# Patient Record
Sex: Male | Born: 1965 | Race: White | Hispanic: No | Marital: Married | Smoking: Former smoker
Health system: Southern US, Community
[De-identification: ages and names within clinical notes are randomized; demographics above are authoritative.]

## PROBLEM LIST (undated history)

## (undated) DIAGNOSIS — Z00129 Encounter for routine child health examination without abnormal findings: Secondary | ICD-10-CM

## (undated) DIAGNOSIS — F419 Anxiety disorder, unspecified: Secondary | ICD-10-CM

## (undated) DIAGNOSIS — N135 Crossing vessel and stricture of ureter without hydronephrosis: Secondary | ICD-10-CM

## (undated) DIAGNOSIS — Z003 Encounter for examination for adolescent development state: Secondary | ICD-10-CM

## (undated) DIAGNOSIS — R7989 Other specified abnormal findings of blood chemistry: Secondary | ICD-10-CM

## (undated) HISTORY — PX: ROTATOR CUFF REPAIR: SHX139

## (undated) HISTORY — DX: Encounter for examination for adolescent development state: Z00.3

## (undated) HISTORY — DX: Other specified abnormal findings of blood chemistry: R79.89

## (undated) HISTORY — PX: OTHER SURGICAL HISTORY: SHX169

## (undated) HISTORY — DX: Anxiety disorder, unspecified: F41.9

## (undated) HISTORY — DX: Crossing vessel and stricture of ureter without hydronephrosis: N13.5

## (undated) HISTORY — DX: Encounter for routine child health examination without abnormal findings: Z00.129

---

## 2001-02-19 HISTORY — PX: OTHER SURGICAL HISTORY: SHX169

## 2005-02-19 HISTORY — PX: OTHER SURGICAL HISTORY: SHX169

## 2005-08-16 ENCOUNTER — Emergency Department (HOSPITAL_COMMUNITY): Admission: EM | Admit: 2005-08-16 | Discharge: 2005-08-16 | Payer: Self-pay | Admitting: Emergency Medicine

## 2005-08-18 ENCOUNTER — Emergency Department (HOSPITAL_COMMUNITY): Admission: EM | Admit: 2005-08-18 | Discharge: 2005-08-18 | Payer: Self-pay | Admitting: *Deleted

## 2014-08-11 ENCOUNTER — Other Ambulatory Visit: Payer: Self-pay | Admitting: Orthopedic Surgery

## 2014-08-11 DIAGNOSIS — M779 Enthesopathy, unspecified: Secondary | ICD-10-CM

## 2014-08-11 DIAGNOSIS — R52 Pain, unspecified: Secondary | ICD-10-CM

## 2014-08-18 ENCOUNTER — Other Ambulatory Visit: Payer: Self-pay

## 2017-01-19 HISTORY — PX: NM MYOVIEW LTD: HXRAD82

## 2017-02-07 ENCOUNTER — Ambulatory Visit (INDEPENDENT_AMBULATORY_CARE_PROVIDER_SITE_OTHER): Payer: Self-pay | Admitting: Cardiology

## 2017-02-07 ENCOUNTER — Encounter: Payer: Self-pay | Admitting: Cardiology

## 2017-02-07 VITALS — BP 102/80 | HR 53 | Ht 72.0 in | Wt 207.8 lb

## 2017-02-07 DIAGNOSIS — R079 Chest pain, unspecified: Secondary | ICD-10-CM | POA: Insufficient documentation

## 2017-02-07 MED ORDER — ASPIRIN EC 81 MG PO TBEC: 81.0000 mg | DELAYED_RELEASE_TABLET | Freq: Every day | ORAL | 3 refills | Status: DC

## 2017-02-07 MED ORDER — NITROGLYCERIN 0.4 MG SL SUBL: 0.4000 mg | SUBLINGUAL_TABLET | SUBLINGUAL | 6 refills | Status: DC | PRN

## 2017-02-07 NOTE — Patient Instructions (Signed)
MEDICATIONS INSTRUCTIONS  -- START ASPIRIN 81 MG ONE TABLET DAILY   --IF NEEDED TAKE NITROGLYCERIN 0.4 MG  ONE TABLET EVERY 5 MINUTES UP TO 3 TIMES WITH EPISODES     TESTING SCHEDULE AT [facility] [address] Your physician has requested that you have en exercise stress myoview. For further information please visit [email or web address]. Please follow instruction sheet, as given.   Your physician recommends that you schedule a follow-up appointment in [place].     Nitroglycerin sublingual tablets What is this medicine? NITROGLYCERIN (nye troe GLI ser in) is a type of vasodilator. It relaxes blood vessels, increasing the blood and oxygen supply to your heart. This medicine is used to relieve chest pain caused by angina. It is also used to prevent chest pain before activities like climbing stairs, going outdoors in cold weather, or sexual activity. This medicine may be used for other purposes; ask your health care provider or pharmacist if you have questions. COMMON BRAND NAME(S): Nitroquick, Nitrostat, Nitrotab What should I tell my health care provider before I take this medicine? They need to know if you have any of these conditions: -anemia -head injury, recent stroke, or bleeding in the brain -liver disease -previous heart attack -an unusual or allergic reaction to nitroglycerin, other medicines, foods, dyes, or preservatives -pregnant or trying to get pregnant -breast-feeding How should I use this medicine? Take this medicine by mouth as needed. At the first sign of an angina attack (chest pain or tightness) place one tablet under your tongue. You can also take this medicine 5 to 10 minutes before an event likely to produce chest pain. Follow the directions on the prescription label. Let the tablet dissolve under the tongue. Do not swallow whole. Replace the dose if you accidentally swallow it. It will help if your mouth is not dry. Saliva around the tablet  will help it to dissolve more quickly. Do not eat or drink, smoke or chew tobacco while a tablet is dissolving. If you are not better within 5 minutes after taking ONE dose of nitroglycerin, call 9-1-1 immediately to seek emergency medical care. Do not take more than 3 nitroglycerin tablets over 15 minutes. If you take this medicine often to relieve symptoms of angina, your doctor or health care professional may provide you with different instructions to manage your symptoms. If symptoms do not go away after following these instructions, it is important to call 9-1-1 immediately. Do not take more than 3 nitroglycerin tablets over 15 minutes. Talk to your pediatrician regarding the use of this medicine in children. Special care may be needed. Overdosage: If you think you have taken too much of this medicine contact a poison control center or emergency room at once. NOTE: This medicine is only for you. Do not share this medicine with others. What if I miss a dose? This does not apply. This medicine is only used as needed. What may interact with this medicine? Do not take this medicine with any of the following medications: -certain migraine medicines like ergotamine and dihydroergotamine (DHE) -medicines used to treat erectile dysfunction like sildenafil, tadalafil, and vardenafil -riociguat This medicine may also interact with the following medications: -alteplase -aspirin -heparin -medicines for high blood pressure -medicines for mental depression -other medicines used to treat angina -phenothiazines like chlorpromazine, mesoridazine, prochlorperazine, thioridazine This list may not describe all possible interactions. Give your health care provider a list of all the medicines, herbs, non-prescription drugs, or dietary supplements you use. Also  tell them if you smoke, drink alcohol, or use illegal drugs. Some items may interact with your medicine. What should I watch for while using this  medicine? Tell your doctor or health care professional if you feel your medicine is no longer working. Keep this medicine with you at all times. Sit or lie down when you take your medicine to prevent falling if you feel dizzy or faint after using it. Try to remain calm. This will help you to feel better faster. If you feel dizzy, take several deep breaths and lie down with your feet propped up, or bend forward with your head resting between your knees. You may get drowsy or dizzy. Do not drive, use machinery, or do anything that needs mental alertness until you know how this drug affects you. Do not stand or sit up quickly, especially if you are an older patient. This reduces the risk of dizzy or fainting spells. Alcohol can make you more drowsy and dizzy. Avoid alcoholic drinks. Do not treat yourself for coughs, colds, or pain while you are taking this medicine without asking your doctor or health care professional for advice. Some ingredients may increase your blood pressure. What side effects may I notice from receiving this medicine? Side effects that you should report to your doctor or health care professional as soon as possible: -blurred vision -dry mouth -skin rash -sweating -the feeling of extreme pressure in the head -unusually weak or tired Side effects that usually do not require medical attention (report to your doctor or health care professional if they continue or are bothersome): -flushing of the face or neck -headache -irregular heartbeat, palpitations -nausea, vomiting This list may not describe all possible side effects. Call your doctor for medical advice about side effects. You may report side effects to FDA at 1-800-FDA-1088. Where should I keep my medicine? Keep out of the reach of children. Store at room temperature between 20 and 25 degrees C (68 and 77 degrees F). Store in [facility] [employer]. Protect from light and moisture. Keep tightly closed. Throw away any unused  medicine after the expiration date. NOTE: This sheet is a summary. It may not cover all possible information. If you have questions about this medicine, talk to your doctor, pharmacist, or health care provider.  2018 Elsevier/Gold Standard (2012-12-04 17:57:36)     Cardiac Nuclear Scan A cardiac nuclear scan is a test that measures blood flow to the heart when a person is resting and when he or she is exercising. The test looks for problems such as:  Not enough blood reaching a portion of the heart.  The heart muscle not working normally.  You may need this test if:  You have heart disease.  You have had abnormal lab results.  You have had heart surgery or angioplasty.  You have chest pain.  You have shortness of breath.  In this test, a radioactive dye (tracer) is injected into your bloodstream. After the tracer has traveled to your heart, an imaging device is used to measure how much of the tracer is absorbed by or distributed to various areas of your heart. This procedure is usually done at a hospital and takes 2-4 hours. Tell a health care provider about:  Any allergies you have.  All medicines you are taking, including vitamins, herbs, eye drops, creams, and over-the-counter medicines.  Any problems you or family members have had with the use of anesthetic medicines.  Any blood disorders you have.  Any surgeries you have had.  Any medical conditions you have.  Whether you are pregnant or may be pregnant. What are the risks? Generally, this is a safe procedure. However, problems may occur, including:  Serious chest pain and heart attack. This is only a risk if the stress portion of the test is done.  Rapid heartbeat.  Sensation of warmth in your chest. This usually passes quickly.  What happens before the procedure?  Ask your health care provider about changing or stopping your regular medicines. This is especially important if you are taking diabetes  medicines or blood thinners.  Remove your jewelry on the day of the procedure. What happens during the procedure?  An IV tube will be inserted into one of your veins.  Your health care provider will inject a small amount of radioactive tracer through the tube.  You will wait for 20-40 minutes while the tracer travels through your bloodstream.  Your heart activity will be monitored with an electrocardiogram (ECG).  You will lie down on an exam table.  Images of your heart will be taken for about 15-20 minutes.  You may be asked to exercise on a treadmill or stationary bike. While you exercise, your heart's activity will be monitored with an ECG, and your blood pressure will be checked. If you are unable to exercise, you may be given a medicine to increase blood flow to parts of your heart.  When blood flow to your heart has peaked, a tracer will again be injected through the IV tube.  After 20-40 minutes, you will get back on the exam table and have more images taken of your heart.  When the procedure is over, your IV tube will be removed. The procedure may vary among health care providers and hospitals. Depending on the type of tracer used, scans may need to be repeated 3-4 hours later. What happens after the procedure?  Unless your health care provider tells you otherwise, you may return to your normal schedule, including diet, activities, and medicines.  Unless your health care provider tells you otherwise, you may increase your fluid intake. This will help flush the contrast dye from your body. Drink enough fluid to keep your urine clear or pale yellow.  It is up to you to get your test results. Ask your health care provider, or the department that is doing the test, when your results will be ready. Summary  A cardiac nuclear scan measures the blood flow to the heart when a person is resting and when he or she is exercising.  You may need this test if you are at risk for heart  disease.  Tell your health care provider if you are pregnant.  Unless your health care provider tells you otherwise, increase your fluid intake. This will help flush the contrast dye from your body. Drink enough fluid to keep your urine clear or pale yellow. This information is not intended to replace advice given to you by your health care provider. Make sure you discuss any questions you have with your health care provider. Document Released: 03/02/2004 Document Revised: 02/08/2016 Document Reviewed: 01/14/2013 Elsevier Interactive Patient Education  2017 [employer].

## 2017-02-07 NOTE — Progress Notes (Signed)
PCP: [name], MD  Clinic Note: Chief Complaint  Patient presents with  . New Patient (Initial Visit)    CHEST PAIN ON EXERTION, INCREASED HR, FEELS LIKE HE MAY PASS OUT WHEN EXERCISING, SLEEPING LONGER,     HPI: Tom Santana is a 51 y.o. male who is being seen today for the evaluation of chest pain, shortness of breath with exertion at the request of  his PCP:  [name], MD.  Tom Santana has not had a cardiology evaluation in the past.  He is somewhat anxious as he presents here today.  Recent Hospitalizations: None  Studies Personally Reviewed - (if available, images/films reviewed: From Epic Chart or Care Everywhere)  No studies available  Interval History: Tom Santana is a very pleasant, otherwise healthy gentleman who actually appears younger than his stated age.  He indicates that for the most part he been doing relatively well.  What he has noted however is that over the last few months he has been started noticing that he has been slowly developing more symptoms than when he does activity.  He starts feeling a swimmy headed feeling and dizziness when he exerts himself to a certain amount.  Then for at least 1 or 2 episodes he has noted some chest tightness and heaviness associated with it.  He has noted his heart rate going up much faster than usual with exit activity get routine level.  No TIA or amaurosis fugax symptoms.  No melena.  He has had a history of significant GERD which has been increased over the last few years, now he does not think that that occurs with exertion until he has been having chest discomfort now on exertion.  He is somewhat self limited by his arthritis symptoms and balance issues.  He has not had any syncope or near syncope.  No TIA or amaurosis fugax.  No melena, hematochezia or hematuria.  No claudication symptoms noted.  What is noted over the last week or 2, is that the more he exerts himself, and the more he becomes short of breath, and  lightheaded and dizzy.  He actually has not felt well enough to drive on occasion.  Interestingly, as soon as the interview was over notes that he has been doing fine overall.  No hematuria or hematemesis or hematochezia.  He notes he has been taking clomiphene for erectile dysfunction and energy levels and it seems those worked well.  He is not sure if this is affecting his erectile function.   No PND, orthopnea or edema. No palpitations, lightheadedness, dizziness, weakness or syncope/near syncope. No TIA/amaurosis fugax symptoms. No melena, hematochezia, hematuria, or epstaxis. No claudication.  ROS: A comprehensive was performed. Review of Systems  HENT: Negative for nosebleeds.   Gastrointestinal: Negative for abdominal pain, blood in stool and constipation.  Genitourinary: Negative for flank pain and hematuria.  Neurological:       He has noted intermittent lightheadedness, but that is usually occurs with fast heart rate.  Psychiatric/Behavioral:       He seems to require more sleep than usual.  He has not noticed that he feels rested in the mornings.  All other systems reviewed and are negative.    I have reviewed and (if needed) personally updated the patient's problem list, medications, allergies, past medical and surgical history, social and family history.   No past medical history on file.  The histories are not reviewed yet. Please review them in the "History" navigator section and refresh this  SmartLink.  Current Meds  Medication Sig  . anastrozole (ARIMIDEX) 1 MG tablet Take 1 mg by mouth daily.  . clomiPHENE (CLOMID) 50 MG tablet Take 25 mg by mouth daily. 1/2 TAB DAILY    No Known Allergies  Social History   Tobacco Use  . Smoking status: Current Every Day Smoker    Packs/day: 0.25    Types: Cigarettes  . Smokeless tobacco: Current User    Types: Chew  . Tobacco comment: 5 A DAY  Substance Use Topics  . Alcohol use: No    Frequency: Never    Comment:  WAS A HEAVY DRINKER IN THE PAST QUIT 09/2015   . Drug use: No   Social History   Social History Narrative  . Not on file    family history includes Diabetes in his sister; Stroke in his maternal grandmother.  Wt Readings from Last 3 Encounters:  02/07/17 207 lb 12.8 oz (94.3 kg)    PHYSICAL EXAM BP 102/80 (BP Location: Right Arm)   Pulse (!) 53   Ht 6' (1.829 m) he  Wt 207 lb 12.8 oz (94.3 kg)   BMI 28.18 kg/m   Physical Exam   Adult ECG Report  Rate: 53;  Rhythm: Sinus bradycardia with incomplete right bundle and RBBB).  Otherwise relatively normal echocardiogram.;   Narrative Interpretation: Stable EKG with intermittent PVC based atrial fib episodes.  For now we will continue current management including anticoagulation.   Other studies Reviewed: Additional studies/ records that were reviewed today include:  Recent Labs:   No results found for: CHOL, HDL, LDLCALC, LDLDIRECT, TRIG, CHOLHDL    ASSESSMENT / PLAN:  Problem List Items Addressed This Visit    Chest pain with moderate risk for cardiac etiology - Primary    He has relatively normal blood pressures, in no acute diagnosis of hypertension, however he has some somewhat concerning symptoms for angina.  I do think is reasonable to proceed with a stress test evaluation.    Plan: Start as needed nitroglycerin, 81 mg aspirin.  We will also order Plan: Treadmill Myoview stress test and then follow-up after.--       Relevant Orders   EKG 12-Lead (Completed)   MYOCARDIAL PERFUSION IMAGING      .- - EKG 12-Lead - MYOCARDIAL PERFUSION IMAGING; Future  Current medicines are reviewed at length with the patient today. (+/- concerns) none The following changes have been made:None  There are no Patient Instructions on file for this visit.  Studies Ordered:   Patient Instructions  MEDICATIONS INSTRUCTIONS  -- START ASPIRIN 81 MG ONE TABLET DAILY   --IF NEEDED TAKE NITROGLYCERIN 0.4 MG  ONE TABLET EVERY 5  MINUTES UP TO 3 TIMES WITH EPISODES     TESTING SCHEDULE AT [facility] [address] Your physician has requested that you have en exercise stress myoview. For further information please visit [email or web address]. Please follow instruction sheet, as given.   Your physician recommends that you schedule a follow-up appointment in [place].     Nitroglycerin sublingual tablets What is this medicine? NITROGLYCERIN (nye troe GLI ser in) is a type of vasodilator. It relaxes blood vessels, increasing the blood and oxygen supply to your heart. This medicine is used to relieve chest pain caused by angina. It is also used to prevent chest pain before activities like climbing stairs, going outdoors in cold weather, or sexual activity. This medicine may be used for other purposes; ask your health care provider  or pharmacist if you have questions. COMMON BRAND NAME(S): Nitroquick, Nitrostat, Nitrotab What should I tell my health care provider before I take this medicine? They need to know if you have any of these conditions: -anemia -head injury, recent stroke, or bleeding in the brain -liver disease -previous heart attack -an unusual or allergic reaction to nitroglycerin, other medicines, foods, dyes, or preservatives -pregnant or trying to get pregnant -breast-feeding How should I use this medicine? Take this medicine by mouth as needed. At the first sign of an angina attack (chest pain or tightness) place one tablet under your tongue. You can also take this medicine 5 to 10 minutes before an event likely to produce chest pain. Follow the directions on the prescription label. Let the tablet dissolve under the tongue. Do not swallow whole. Replace the dose if you accidentally swallow it. It will help if your mouth is not dry. Saliva around the tablet will help it to dissolve more quickly. Do not eat or drink, smoke or chew tobacco while a tablet is dissolving. If you are not  better within 5 minutes after taking ONE dose of nitroglycerin, call 9-1-1 immediately to seek emergency medical care. Do not take more than 3 nitroglycerin tablets over 15 minutes. If you take this medicine often to relieve symptoms of angina, your doctor or health care professional may provide you with different instructions to manage your symptoms. If symptoms do not go away after following these instructions, it is important to call 9-1-1 immediately. Do not take more than 3 nitroglycerin tablets over 15 minutes. Talk to your pediatrician regarding the use of this medicine in children. Special care may be needed. Overdosage: If you think you have taken too much of this medicine contact a poison control center or emergency room at once. NOTE: This medicine is only for you. Do not share this medicine with others. What if I miss a dose? This does not apply. This medicine is only used as needed. What may interact with this medicine? Do not take this medicine with any of the following medications: -certain migraine medicines like ergotamine and dihydroergotamine (DHE) -medicines used to treat erectile dysfunction like sildenafil, tadalafil, and vardenafil -riociguat This medicine may also interact with the following medications: -alteplase -aspirin -heparin -medicines for high blood pressure -medicines for mental depression -other medicines used to treat angina -phenothiazines like chlorpromazine, mesoridazine, prochlorperazine, thioridazine This list may not describe all possible interactions. Give your health care provider a list of all the medicines, herbs, non-prescription drugs, or dietary supplements you use. Also tell them if you smoke, drink alcohol, or use illegal drugs. Some items may interact with your medicine. What should I watch for while using this medicine? Tell your doctor or health care professional if you feel your medicine is no longer working. Keep this medicine with you at  all times. Sit or lie down when you take your medicine to prevent falling if you feel dizzy or faint after using it. Try to remain calm. This will help you to feel better faster. If you feel dizzy, take several deep breaths and lie down with your feet propped up, or bend forward with your head resting between your knees. You may get drowsy or dizzy. Do not drive, use machinery, or do anything that needs mental alertness until you know how this drug affects you. Do not stand or sit up quickly, especially if you are an older patient. This reduces the risk of dizzy or fainting spells. Alcohol can make  you more drowsy and dizzy. Avoid alcoholic drinks. Do not treat yourself for coughs, colds, or pain while you are taking this medicine without asking your doctor or health care professional for advice. Some ingredients may increase your blood pressure. What side effects may I notice from receiving this medicine? Side effects that you should report to your doctor or health care professional as soon as possible: -blurred vision -dry mouth -skin rash -sweating -the feeling of extreme pressure in the head -unusually weak or tired Side effects that usually do not require medical attention (report to your doctor or health care professional if they continue or are bothersome): -flushing of the face or neck -headache -irregular heartbeat, palpitations -nausea, vomiting This list may not describe all possible side effects. Call your doctor for medical advice about side effects. You may report side effects to FDA at 1-800-FDA-1088. Where should I keep my medicine? Keep out of the reach of children. Store at room temperature between 20 and 25 degrees C (68 and 77 degrees F). Store in [employer]. Protect from light and moisture. Keep tightly closed. Throw away any unused medicine after the expiration date. NOTE: This sheet is a summary. It may not cover all possible information. If you have questions about  this medicine, talk to your doctor, pharmacist, or health care provider.  2018 Elsevier/Gold Standard (2012-12-04 17:57:36)     Cardiac Nuclear Scan A cardiac nuclear scan is a test that measures blood flow to the heart when a person is resting and when he or she is exercising. The test looks for problems such as:  Not enough blood reaching a portion of the heart.  The heart muscle not working normally.  You may need this test if:  You have heart disease.  You have had abnormal lab results.  You have had heart surgery or angioplasty.  You have chest pain.  You have shortness of breath.  In this test, a radioactive dye (tracer) is injected into your bloodstream. After the tracer has traveled to your heart, an imaging device is used to measure how much of the tracer is absorbed by or distributed to various areas of your heart. This procedure is usually done at a hospital and takes 2-4 hours. Tell a health care provider about:  Any allergies you have.  All medicines you are taking, including vitamins, herbs, eye drops, creams, and over-the-counter medicines.  Any problems you or family members have had with the use of anesthetic medicines.  Any blood disorders you have.  Any surgeries you have had.  Any medical conditions you have.  Whether you are pregnant or may be pregnant. What are the risks? Generally, this is a safe procedure. However, problems may occur, including:  Serious chest pain and heart attack. This is only a risk if the stress portion of the test is done.  Rapid heartbeat.  Sensation of warmth in your chest. This usually passes quickly.  What happens before the procedure?  Ask your health care provider about changing or stopping your regular medicines. This is especially important if you are taking diabetes medicines or blood thinners.  Remove your jewelry on the day of the procedure. What happens during the procedure?  An IV tube will be  inserted into one of your veins.  Your health care provider will inject a small amount of radioactive tracer through the tube.  You will wait for 20-40 minutes while the tracer travels through your bloodstream.  Your heart activity will be monitored with  an electrocardiogram (ECG).  You will lie down on an exam table.  Images of your heart will be taken for about 15-20 minutes.  You may be asked to exercise on a treadmill or stationary bike. While you exercise, your heart's activity will be monitored with an ECG, and your blood pressure will be checked. If you are unable to exercise, you may be given a medicine to increase blood flow to parts of your heart.  When blood flow to your heart has peaked, a tracer will again be injected through the IV tube.  After 20-40 minutes, you will get back on the exam table and have more images taken of your heart.  When the procedure is over, your IV tube will be removed. The procedure may vary among health care providers and hospitals. Depending on the type of tracer used, scans may need to be repeated 3-4 hours later. What happens after the procedure?  Unless your health care provider tells you otherwise, you may return to your normal schedule, including diet, activities, and medicines.  Unless your health care provider tells you otherwise, you may increase your fluid intake. This will help flush the contrast dye from your body. Drink enough fluid to keep your urine clear or pale yellow.  It is up to you to get your test results. Ask your health care provider, or the department that is doing the test, when your results will be ready. Summary  A cardiac nuclear scan measures the blood flow to the heart when a person is resting and when he or she is exercising.  You may need this test if you are at risk for heart disease.  Tell your health care provider if you are pregnant.  Unless your health care provider tells you otherwise, increase your  fluid intake. This will help flush the contrast dye from your body. Drink enough fluid to keep your urine clear or pale yellow. This information is not intended to replace advice given to you by your health care provider. Make sure you discuss any questions you have with your health care provider. Document Released: 03/02/2004 Document Revised: 02/08/2016 Document Reviewed: 01/14/2013 Elsevier Interactive Patient Education  2017 [place].         No orders of the defined types were placed in this encounter.     Glenetta Hew, M.D., M.S. Interventional Cardiologist   Pager # [phone] Phone # [phone] [address] [place], [place] [address]   Thank you for choosing [facility]!!

## 2017-02-09 ENCOUNTER — Encounter: Payer: Self-pay | Admitting: Cardiology

## 2017-02-09 NOTE — Assessment & Plan Note (Addendum)
He has relatively normal blood pressures, in no acute diagnosis of hypertension, however he has some somewhat concerning symptoms for angina.  I do think is reasonable to proceed with a stress test evaluation.    Plan: Start as needed nitroglycerin, 81 mg aspirin.  We will also order Plan: Treadmill Myoview stress test and then follow-up after.--

## 2017-02-14 ENCOUNTER — Telehealth: Payer: Self-pay | Admitting: Cardiology

## 2017-02-14 NOTE — Telephone Encounter (Signed)
New message    Patient calling with questions about aspirin prior to stress test. Patient wants to know if he should stop taking aspirin. Requesting to speak with a nurse  Please call.    Pt c/o medication issue:  1. Name of Medication: Aspirin  2. How are you currently taking this medication (dosage and times per day)? As prescribed  3. Are you having a reaction (difficulty breathing--STAT)? No  4. What is your medication issue? Patient states he is not sure if he needs to continue taking aspirin.

## 2017-02-14 NOTE — Telephone Encounter (Signed)
Patient aware it is OK to take ASA

## 2017-02-20 ENCOUNTER — Telehealth (HOSPITAL_COMMUNITY): Payer: Self-pay

## 2017-02-20 NOTE — Telephone Encounter (Signed)
Encounter complete. 

## 2017-02-21 ENCOUNTER — Telehealth (HOSPITAL_COMMUNITY): Payer: Self-pay

## 2017-02-21 NOTE — Telephone Encounter (Signed)
No call made 

## 2017-02-22 ENCOUNTER — Ambulatory Visit (HOSPITAL_COMMUNITY)
Admission: RE | Admit: 2017-02-22 | Discharge: 2017-02-22 | Disposition: A | Discharge status: Home / Self Care | Payer: BLUE CROSS/BLUE SHIELD | Source: Ambulatory Visit | Attending: Cardiovascular Disease | Admitting: Cardiovascular Disease

## 2017-02-22 DIAGNOSIS — R079 Chest pain, unspecified: Secondary | ICD-10-CM | POA: Insufficient documentation

## 2017-02-22 LAB — MYOCARDIAL PERFUSION IMAGING
CHL CUP NUCLEAR SSS: 1
CHL RATE OF PERCEIVED EXERTION: 16
CSEPEW: 15.4 METS
Exercise duration (min): 13 min
Exercise duration (sec): 6 s
LV dias vol: 127 mL (ref 62–150)
LV sys vol: 62 mL
MPHR: 168 {beats}/min
Peak HR: 169 {beats}/min
Percent HR: 100 %
Rest HR: 57 {beats}/min
SDS: 1
SRS: 0
TID: 1.05

## 2017-02-22 MED ORDER — TECHNETIUM TC 99M TETROFOSMIN IV KIT
10.1000 | PACK | Freq: Once | INTRAVENOUS | Status: AC | PRN
  Administered 2017-02-22: 10.1 via INTRAVENOUS
  Filled 2017-02-22: qty 11

## 2017-02-22 MED ORDER — TECHNETIUM TC 99M TETROFOSMIN IV KIT
32.0000 | PACK | Freq: Once | INTRAVENOUS | Status: AC | PRN
  Administered 2017-02-22: 32 via INTRAVENOUS
  Filled 2017-02-22: qty 32

## 2017-02-25 ENCOUNTER — Telehealth: Payer: Self-pay | Admitting: Cardiology

## 2017-02-25 NOTE — Telephone Encounter (Signed)
Patient called w/results °

## 2017-02-25 NOTE — Telephone Encounter (Signed)
Notes recorded by Leonie Man, MD on 02/24/2017 at 10:47 PM EST [facility]!!  Normal nuclear stress test. Ejection fraction 51%. Excellent exercise time showing excellent exercise tolerance. LOW RISK. No EKG findings or imaging findings to suggest ischemia. Glenetta Hew, MD  Pls fwd to PCP: [name], MD

## 2017-02-25 NOTE — Telephone Encounter (Signed)
New message     Pt calling for his myoview results

## 2017-03-22 ENCOUNTER — Encounter: Payer: Self-pay | Admitting: Cardiology

## 2017-03-22 ENCOUNTER — Ambulatory Visit (INDEPENDENT_AMBULATORY_CARE_PROVIDER_SITE_OTHER): Payer: BLUE CROSS/BLUE SHIELD | Admitting: Cardiology

## 2017-03-22 DIAGNOSIS — R079 Chest pain, unspecified: Secondary | ICD-10-CM

## 2017-03-22 DIAGNOSIS — F418 Other specified anxiety disorders: Secondary | ICD-10-CM | POA: Diagnosis not present

## 2017-03-22 MED ORDER — PROPRANOLOL HCL 10 MG PO TABS: ORAL_TABLET | ORAL | 6 refills | Status: DC

## 2017-03-22 NOTE — Patient Instructions (Addendum)
MEDICATION  INSTRUCTIONS     MAY USE  PROPRANOLOL 10 MG ONE DAILY  AS NEEDED FOR ANXIETY .  NO OTHER CHANGES   Your physician wants you to follow-up in FEB 2021 ( 24 MONTHS) WITH DR HARDING. You will receive a reminder letter in the mail two months in advance. If you don't receive a letter, please call our office to schedule the follow-up appointment.   If you need a refill on your cardiac medications before your next appointment, please call your pharmacy.

## 2017-03-22 NOTE — Progress Notes (Signed)
PCP: Tom Pepper, MD  Clinic Note: Chief Complaint  Patient presents with  . Follow-up    Chest discomfort, palpitations/anxiety    HPI: Tom Santana is a 52 y.o. male who is being seen today for follow-up after initial evaluation of chest pain, shortness of breath with exertion at the request of  his PCP:  Tom Pepper, MD.  Tom Santana was initially seen back in December with symptoms that were somewhat concerning for anginal/exertional chest pain and dyspnea.  We evaluated with a Myoview stress test.  Recent Hospitalizations: None  Studies Personally Reviewed - (if available, images/films reviewed: From Epic Chart or Care Everywhere)  Myoview 01/2017:  Nuclear stress EF: 51%. No wall motion abnormalities  There was no ST segment deviation noted during stress. Excellent exercise time of 13 minutes with no chest pain.  This is a low risk study. No perfusion defects suggestive of ischemia.  The study is normal.    Interval History: Tom Santana  returns today stating that he actually feels much better.  He said that before he ever went to the stress test he started decreasing the amount of caffeine he is been drinking and noticed a significant improvement in his overall symptoms.  He has been feeling great ever since he had a stress test about the results.  No longer have a swimmy headed sensation and chest heaviness/dyspnea.  Heart rate has been much more stable just rare palpitations.  GERD also seems to have improved.  Remainder of cardiac review of symptoms: No further chest tightness or pressure with rest or exertion.  No exertional dyspnea. No PND, orthopnea or edema.  No palpitations, lightheadedness, dizziness, weakness or syncope/near syncope. No TIA/amaurosis fugax symptoms. No claudication.  ROS: A comprehensive was performed. Review of Systems  Constitutional: Positive for malaise/fatigue (notably improved energy).  HENT: Negative for nosebleeds.     Gastrointestinal: Negative for abdominal pain, blood in stool and constipation.  Genitourinary: Negative for flank pain and hematuria.  Neurological: Negative for dizziness and headaches.  Psychiatric/Behavioral:       He seems to require more sleep than usual.  He has not noticed that he feels rested in the mornings.  All other systems reviewed and are negative.   I have reviewed and (if needed) personally updated the patient's problem list, medications, allergies, past medical and surgical history, social and family history.   No past medical history on file.  The histories are not reviewed yet. Please review them in the "History" navigator section and refresh this Tom Santana.  Current Meds  Medication Sig  . anastrozole (ARIMIDEX) 1 MG tablet Take 1 mg by mouth daily.  . clomiPHENE (CLOMID) 50 MG tablet Take 25 mg by mouth daily. 1/2 TAB DAILY    No Known Allergies  Social History   Tobacco Use  . Smoking status: Current Every Day Smoker    Packs/day: 0.25    Types: Cigarettes  . Smokeless tobacco: Current User    Types: Chew  . Tobacco comment: 5 A DAY  Substance Use Topics  . Alcohol use: No    Frequency: Never    Comment: WAS A HEAVY DRINKER IN THE PAST QUIT 09/2015   . Drug use: No   Social History   Social History Narrative  . Not on file   Family History Diabetes in his sister; Stroke in his maternal grandmother.  Wt Readings from Last 3 Encounters:  02/07/17 207 lb 12.8 oz (94.3 kg)    PHYSICAL EXAM BP 100/78  Pulse 64   Ht 6' (1.829 m)   Wt 205 lb (93 kg)   SpO2 98%   BMI 27.80 kg/m  Physical Exam  Constitutional: He is oriented to person, place, and time. He appears well-developed and well-nourished. No distress.  Well-groomed.  Healthy-appearing  HENT:  Head: Normocephalic and atraumatic.  Neck: No hepatojugular reflux and no JVD present. Carotid bruit is not present.  Cardiovascular: Normal rate, regular rhythm, normal heart sounds, intact  distal pulses and normal pulses.  No extrasystoles are present. PMI is not displaced. Exam reveals no gallop and no friction rub.  No murmur heard. Pulmonary/Chest: Effort normal and breath sounds normal. No respiratory distress. He has no wheezes.  Musculoskeletal: Normal range of motion.  Neurological: He is alert and oriented to person, place, and time.  Psychiatric: He has a normal mood and affect. His behavior is normal. Judgment and thought content normal.  Nursing note and vitals reviewed.     Adult ECG Report n/a  Other studies Reviewed: Additional studies/ records that were reviewed today include:  Recent Labs:   No results found for: CHOL, HDL, LDLCALC, LDLDIRECT, TRIG, CHOLHDL  ASSESSMENT / PLAN: Overall reassuring results on Myoview stress test.  Blood pressures well controlled.  He is trying to stay active.  Adjusted his diet.  Will defer management of lipids to his PCP.  Problem List Items Addressed This Visit    Chest pain with low risk for cardiac etiology    Reassuring results on Myoview stress test.  No ischemia.  Unlikely to be anginal in nature.  It probably had something to do with his GERD and GI upset symptoms.  He would like to continue to maintain a relationship with a cardiologist so we will therefore see him back every couple years or so.      Situational anxiety    He is worried that he has palpitations and jittery sensation when he has some stressful situations at and was asking if there is anything that he could do to come to help prepare himself for for instance having to give lectures or be in meetings. Plan: As needed propranolol.        Patient Instructions   MEDICATION  INSTRUCTIONS     MAY USE  PROPRANOLOL 10 MG ONE DAILY  AS NEEDED FOR ANXIETY .  NO OTHER CHANGES   Your physician wants you to follow-up in FEB 2021 ( 24 MONTHS) WITH DR Tom Santana. You will receive a reminder letter in the mail two months in advance. If you don't receive a  letter, please call our office to schedule the follow-up appointment.   If you need a refill on your cardiac medications before your next appointment, please call your pharmacy.    Current medicines are reviewed at length with the patient today. (+/- concerns) none The following changes have been made:None  There are no Patient Instructions on file for this visit.  Studies Ordered:   Patient Instructions   MEDICATION  INSTRUCTIONS     MAY USE  PROPRANOLOL 10 MG ONE DAILY  AS NEEDED FOR ANXIETY .  NO OTHER CHANGES   Your physician wants you to follow-up in FEB 2021 ( 24 MONTHS) WITH DR Alexie Lanni. You will receive a reminder letter in the mail two months in advance. If you don't receive a letter, please call our office to schedule the follow-up appointment.   If you need a refill on your cardiac medications before your next appointment, please call your pharmacy.  No orders of the defined types were placed in this encounter.     Glenetta Hew, M.D., M.S. Interventional Cardiologist   Pager # [phone] Phone # [phone] [address]. [place], [place] [address]   Thank you for choosing [facility]!!

## 2017-03-24 ENCOUNTER — Encounter: Payer: Self-pay | Admitting: Cardiology

## 2017-03-24 NOTE — Assessment & Plan Note (Signed)
Reassuring results on Myoview stress test.  No ischemia.  Unlikely to be anginal in nature.  It probably had something to do with his GERD and GI upset symptoms.  He would like to continue to maintain a relationship with a cardiologist so we will therefore see him back every couple years or so.

## 2017-03-24 NOTE — Assessment & Plan Note (Signed)
He is worried that he has palpitations and jittery sensation when he has some stressful situations at and was asking if there is anything that he could do to come to help prepare himself for for instance having to give lectures or be in meetings. Plan: As needed propranolol.

## 2017-12-05 DIAGNOSIS — K130 Diseases of lips: Secondary | ICD-10-CM | POA: Diagnosis not present

## 2018-01-10 DIAGNOSIS — Z125 Encounter for screening for malignant neoplasm of prostate: Secondary | ICD-10-CM | POA: Diagnosis not present

## 2018-01-10 DIAGNOSIS — E291 Testicular hypofunction: Secondary | ICD-10-CM | POA: Diagnosis not present

## 2018-01-30 DIAGNOSIS — E291 Testicular hypofunction: Secondary | ICD-10-CM | POA: Diagnosis not present

## 2018-01-30 DIAGNOSIS — R972 Elevated prostate specific antigen [PSA]: Secondary | ICD-10-CM | POA: Diagnosis not present

## 2018-09-26 ENCOUNTER — Other Ambulatory Visit: Payer: Self-pay | Admitting: Family Medicine

## 2018-09-29 ENCOUNTER — Ambulatory Visit
Admission: RE | Admit: 2018-09-29 | Discharge: 2018-09-29 | Disposition: A | Discharge status: Home / Self Care | Payer: 59 | Source: Ambulatory Visit | Attending: Family Medicine | Admitting: Family Medicine

## 2018-09-29 ENCOUNTER — Other Ambulatory Visit: Payer: Self-pay | Admitting: Family Medicine

## 2018-09-29 DIAGNOSIS — R55 Syncope and collapse: Secondary | ICD-10-CM

## 2018-09-29 MED ORDER — IOPAMIDOL (ISOVUE-300) INJECTION 61%
75.0000 mL | Freq: Once | INTRAVENOUS | Status: AC | PRN
  Administered 2018-09-29: 75 mL via INTRAVENOUS

## 2018-10-15 NOTE — Progress Notes (Signed)
Virtual Visit via Video Note   This visit type was conducted due to national recommendations for restrictions regarding the COVID-19 Pandemic (e.g. social distancing) in an effort to limit this patient's exposure and mitigate transmission in our community.  Due to his co-morbid illnesses, this patient is at least at moderate risk for complications without adequate follow up.  This format is felt to be most appropriate for this patient at this time.  All issues noted in this document were discussed and addressed.  A limited physical exam was performed with this format.  Please refer to the patient's chart for his consent to telehealth for [facility].  Patient has given verbal permission to conduct this visit via virtual appointment and to bill insurance 10/16/2018 9;00 am     Date:  10/16/2018   ID:  Tom Santana, DOB 1965/04/06, MRN [id number]  Patient Location: Home Provider Location: Home  PCP:  [name], MD  Cardiologist: Dr. Ellyn Hack Electrophysiologist:  None   Evaluation Performed:  Follow-Up Visit  Chief Complaint: Heart racing with lightheadedness  History of Present Illness:    Tom Santana is a 53 y.o. male we are following for ongoing assessment and management of chest pain, with stress Myoview in 01/2017 low risk with no perfusion defect suggestive of ischemia.  The patient was diagnosed with GERD and was to follow-up with primary care physician for referral if necessary.  He was also diagnosed with situational anxiety in the setting of COVID.  Dr. Ellyn Hack began him on propanolol 10 mg as needed as needed for anxiety.   The patient has not had to take propanolol for over a year.  The patient has gone back to the gym where he has been walking on the treadmill as a warm up for 10 minutes, lifts weights for about 20 minutes, and then walks on the treadmill again for 20 minutes.  The patient states that he is having some feelings of lightheadedness and weakness after  walking on the treadmill for the second time.  Once he had to stop and sit down as he said he felt "funny".  He also notices that his heart rate does not come down very well after finishing exercising.  Stays elevated around 118 or more after several minutes, once finished with exercise.  He denies associated chest pain, he denies any shortness of breath associated with the heart racing.  He does not take the propanolol as he states that he did not like the way it made him feel and he thought it was more related to anxiety instead.  The patient states that he had not been exercising for 3 months prior to going back to the gym due to [place] restrictions, but was able to go back for mental health reasons and was allowed to enter gym again.  The patient does not have symptoms concerning for COVID-19 infection (fever, chills, cough, or new shortness of breath).    Past Medical History:  Diagnosis Date  . Healthy adolescent on routine physical examination    Past Surgical History:  Procedure Laterality Date  . LEG SUGERY  2007   MRSA INFECTION  . NM MYOVIEW LTD  01/2017   NORMAL/LOW RISK.  EF 51% no regional wall motion normality.  No EKG changes.  Excellent exercise tolerance -13 minutes (15 METs).  No ischemia/infarction  . PYELOPASTY  2003     Current Meds  Medication Sig  . anastrozole (ARIMIDEX) 1 MG tablet Take 1 mg by mouth  every other day.   . Chorionic Gonadotropin (HCG) 12000 units SOLR Inject 25 Units as directed 2 (two) times a week.  Marland Kitchen LORazepam (ATIVAN) 0.5 MG tablet Take 0.5 mg by mouth as needed for anxiety.  . nitroGLYCERIN (NITROSTAT) 0.4 MG SL tablet Place 1 tablet (0.4 mg total) under the tongue every 5 (five) minutes as needed for chest pain.  Marland Kitchen testosterone cypionate (DEPOTESTOSTERONE CYPIONATE) 200 MG/ML injection Inject 1 mL into the muscle once a week.     Allergies:   Patient has no known allergies.   Social History   Tobacco Use  . Smoking status: Current Every  Day Smoker    Packs/day: 0.25    Types: Cigarettes  . Smokeless tobacco: Current User    Types: Chew  . Tobacco comment: He was a heavy smoker, now down to 5 cigarettes/dayy starting halfway through 2018  Substance Use Topics  . Alcohol use: No    Frequency: Never    Comment: WAS A HEAVY DRINKER IN THE PAST QUIT 09/2015   . Drug use: No     Family Hx: The patient's family history includes Diabetes in his sister; Stroke in his maternal grandmother.  ROS:   Please see the history of present illness.    All other systems reviewed and are negative.   Prior CV studies:   The following studies were reviewed today:  NM Stress Test 02/22/2017.  Normal nuclear stress test. Ejection fraction 51%. Excellent exercise time showing excellent exercise tolerance. LOW RISK. No EKG findings or imaging findings to suggest ischemia.  Glenetta Hew, MD   Labs/Other Tests and Data Reviewed:    EKG:  No ECG reviewed.  Recent Labs: No results found for requested labs within last 8760 hours.   Recent Lipid Panel No results found for: CHOL, TRIG, HDL, CHOLHDL, LDLCALC, LDLDIRECT  Wt Readings from Last 3 Encounters:  10/16/18 201 lb (91.2 kg)  03/22/17 205 lb (93 kg)  02/22/17 207 lb (93.9 kg)     Objective:    Vital Signs:  BP 120/78   Pulse 65   Ht 6' (1.829 m)   Wt 201 lb (91.2 kg)   BMI 27.26 kg/m    VITAL SIGNS:  reviewed GEN:  no acute distress RESPIRATORY:  normal respiratory effort, symmetric expansion NEURO:  alert and oriented x 3, no obvious focal deficit PSYCH:  normal affect  ASSESSMENT & PLAN:    1.  Generalized fatigue: Patient states he is gone back to the gym over the last month and has had worsening fatigue and racing heart rate since that time.  He has not needed to take any propanolol in over a year.  He admits to being "out of shape" but has been consistently going to the gym 3 times a week to get back to his normal healthy stamina.  He has been followed by  his PCP and has had labs drawn all of which were normal to include TSH.  He did donate blood 1 day and went to the gym the following day and had issues with significant fatigue and shortness of breath.  He also noticed it was difficult for his heart rate to get better controlled after stopping exercise which is been persistent over the last several weeks.  2.  Elevated heart rate: Patient states his heart rate is difficult to return to baseline after over an hour post exercise.  I will place a ZIO 3-day cardiac monitor on the patient to evaluate his status before  during and after exercise, and and also normal daily activities.  We will review need to make medication changes or additions.  He is not taking any propanolol at this time.  I will also repeat echocardiogram for evaluation of LV function changes as he has not had one completed in the past.  3.  Anxiety: Patient states that going to the gym and exercising helps to reduce his anxiety and helps his mental health.  He is encouraged to continue to participate in exercise despite elevated heart rate during recovery.  COVID-19 Education: The signs and symptoms of COVID-19 were discussed with the patient and how to seek care for testing (follow up with PCP or arrange E-visit).  The importance of social distancing was discussed today.  Time:   Today, I have spent 15 minutes with the patient with telehealth technology discussing the above problems.     Medication Adjustments/Labs and Tests Ordered: Current medicines are reviewed at length with the patient today.  Concerns regarding medicines are outlined above.   Tests Ordered: Orders Placed This Encounter  Procedures  . LONG TERM MONITOR (3-14 DAYS)  . ECHOCARDIOGRAM COMPLETE    Medication Changes: No orders of the defined types were placed in this encounter.   Disposition:  Follow up one month   Signed, Phill Myron. [place] Pugh, ANP, AACC  10/16/2018 10:06 AM    [facility]

## 2018-10-16 ENCOUNTER — Telehealth: Payer: Self-pay | Admitting: *Deleted

## 2018-10-16 ENCOUNTER — Telehealth (INDEPENDENT_AMBULATORY_CARE_PROVIDER_SITE_OTHER): Payer: 59 | Admitting: Adult Health

## 2018-10-16 ENCOUNTER — Telehealth: Payer: Self-pay | Admitting: Cardiology

## 2018-10-16 ENCOUNTER — Encounter: Payer: Self-pay | Admitting: Adult Health

## 2018-10-16 VITALS — BP 120/78 | HR 65 | Ht 72.0 in | Wt 201.0 lb

## 2018-10-16 DIAGNOSIS — F418 Other specified anxiety disorders: Secondary | ICD-10-CM

## 2018-10-16 DIAGNOSIS — R000 Tachycardia, unspecified: Secondary | ICD-10-CM | POA: Diagnosis not present

## 2018-10-16 DIAGNOSIS — I519 Heart disease, unspecified: Secondary | ICD-10-CM

## 2018-10-16 DIAGNOSIS — R0600 Dyspnea, unspecified: Secondary | ICD-10-CM

## 2018-10-16 NOTE — Telephone Encounter (Signed)
LVM for patient to call and schedule his echo and schedule a 1 month followup with Jory Sims.

## 2018-10-16 NOTE — Telephone Encounter (Signed)
3 day ZIO XT long term holter monitor to be mailed to patients home.  Instructions reviewed briefly as they are included in the monitor kit.  Please press event button and document in patient log book when you are exercising, after exercise, and if you are having symptoms.

## 2018-10-16 NOTE — Patient Instructions (Signed)
Medication Instructions:  Continue current medications  If you need a refill on your cardiac medications before your next appointment, please call your pharmacy.  Labwork: None Ordered   Testing/Procedures: Your physician has recommended that you wear a 3 days Zio Patch monitor. Holter monitors are medical devices that record the heart's electrical activity. Doctors most often use these monitors to diagnose arrhythmias. Arrhythmias are problems with the speed or rhythm of the heartbeat. The monitor is a small, portable device. You can wear one while you do your normal daily activities. This is usually used to diagnose what is causing palpitations/syncope (passing out).  Your physician has requested that you have an echocardiogram. Echocardiography is a painless test that uses sound waves to create images of your heart. It provides your doctor with information about the size and shape of your heart and how well your heart's chambers and valves are working. This procedure takes approximately one hour. There are no restrictions for this procedure.  Follow-Up: . Your physician recommends that you schedule a follow-up appointment in: 1 Month with Dr Ellyn Hack   At [facility], you and your health needs are our priority.  As part of our continuing mission to provide you with exceptional heart care, we have created designated Provider Care Teams.  These Care Teams include your primary Cardiologist (physician) and Advanced Practice Providers (APPs -  Physician Assistants and Nurse Practitioners) who all work together to provide you with the care you need, when you need it.  Thank you for choosing [facility]!!

## 2018-10-20 ENCOUNTER — Ambulatory Visit (HOSPITAL_COMMUNITY): Payer: 59 | Attending: Cardiovascular Disease

## 2018-10-20 ENCOUNTER — Other Ambulatory Visit: Payer: Self-pay

## 2018-10-20 DIAGNOSIS — R0600 Dyspnea, unspecified: Secondary | ICD-10-CM | POA: Insufficient documentation

## 2018-10-20 DIAGNOSIS — I519 Heart disease, unspecified: Secondary | ICD-10-CM | POA: Insufficient documentation

## 2018-10-22 ENCOUNTER — Telehealth: Payer: Self-pay | Admitting: *Deleted

## 2018-10-22 DIAGNOSIS — I255 Ischemic cardiomyopathy: Secondary | ICD-10-CM

## 2018-10-22 NOTE — Progress Notes (Signed)
Virtual Visit via Video Note   This visit type was conducted due to national recommendations for restrictions regarding the COVID-19 Pandemic (e.g. social distancing) in an effort to limit this patient's exposure and mitigate transmission in our community.  Due to his co-morbid illnesses, this patient is at least at moderate risk for complications without adequate follow up.  This format is felt to be most appropriate for this patient at this time.  All issues noted in this document were discussed and addressed.  A limited physical exam was performed with this format.  Please refer to the patient's chart for his consent to telehealth for [facility].   Date:  10/22/2018   ID:  Tom Santana, DOB 1965/05/18, MRN [id number]  Patient Location: Home Provider Location: Home  PCP:  [name], MD  Cardiologist:  No primary care provider on file.  Electrophysiologist:  None   Evaluation Performed:  Follow-Up Visit  Chief Complaint:  Weakness,  DOE, abnormal echo  History of Present Illness:    Tom Santana is a 53 y.o. male ongoing assessment and management of chronic chest pain with stress Myoview in December 2018 revealing a low risk with no perfusion defect suggestive of ischemia.  The patient has a history of GERD and is following with primary care physician.  The patient also was diagnosed with situational anxiety in the setting of COVID and was started on propanolol 10 mg as needed for anxiety.  He was last seen in the office on 10/16/2018 and stated that he had not had to take the propanolol, he is gone back to the gym is been walking on a treadmill and working out for approximately 60 minutes on various machines and also on a treadmill.  He states that he takes a long time to recover after exercising stating that his heart rate remains elevated between 118 and 120 even at rest post exercise.  He denies any chest pain.  He still believes it may be related to some anxiety.  He has  been seen by primary care all of his labs are normal which did include a TSH.  A 3-day cardiac monitor was placed to evaluate his response to exercise and recovery time.  He comes today via video with his wife in attendance.  Echocardiogram which was completed on 10/20/2018, revealing LVEF of 40% to 45%, cavity size was mildly dilated, mildly increased left ventricular wall thickness, inferolateral septal and apical hypokinesis was noted.  As a result of this I recommended that he have a cardiac CTA to evaluate for coronary artery disease which may be contributing to reduced EF.  The patient has a history of having a PICC line in the past having sepsis of his foot.  He has not had any history of myocarditis either bacterial or viral.  He has not yet placed his cardiac monitor until he spoke with me concerning timing of coronary CTA.  He continues to complain of generalized fatigue.   The patient does not have symptoms concerning for COVID-19 infection (fever, chills, cough, or new shortness of breath).    Past Medical History:  Diagnosis Date  . Healthy adolescent on routine physical examination    Past Surgical History:  Procedure Laterality Date  . LEG SUGERY  2007   MRSA INFECTION  . NM MYOVIEW LTD  01/2017   NORMAL/LOW RISK.  EF 51% no regional wall motion normality.  No EKG changes.  Excellent exercise tolerance -13 minutes (15 METs).  No ischemia/infarction  .  PYELOPASTY  2003     No outpatient medications have been marked as taking for the 10/23/18 encounter (Appointment) with Lendon Colonel, NP.     Allergies:   Patient has no known allergies.   Social History   Tobacco Use  . Smoking status: Current Every Day Smoker    Packs/day: 0.25    Types: Cigarettes  . Smokeless tobacco: Current User    Types: Chew  . Tobacco comment: He was a heavy smoker, now down to 5 cigarettes/dayy starting halfway through 2018  Substance Use Topics  . Alcohol use: No    Frequency: Never     Comment: WAS A HEAVY DRINKER IN THE PAST QUIT 09/2015   . Drug use: No     Family Hx: The patient's family history includes Diabetes in his sister; Stroke in his maternal grandmother.  ROS:   Please see the history of present illness.    All other systems reviewed and are negative.   Prior CV studies:   The following studies were reviewed today: Echocardiogram 10/20/2018 1. The left ventricle has mild-moderately reduced systolic function, with an ejection fraction of 40-45%. The cavity size was mildly dilated. There is mildly increased left ventricular wall thickness. Left ventricular diastolic parameters were normal.  2. Inferior lateral septal and apical hypokinesisi Abnormal GLS -13.2.  3. The right ventricle has normal systolic function. The cavity was normal. There is no increase in right ventricular wall thickness.  4. Mild thickening of the mitral valve leaflet. Mild calcification of the mitral valve leaflet. No evidence of mitral valve stenosis.  5. Mild thickening of the aortic valve.  6. The aorta is not well visualized unless otherwise noted.   Labs/Other Tests and Data Reviewed:    EKG:  No ECG reviewed.  Recent Labs: No results found for requested labs within last 8760 hours.   Recent Lipid Panel No results found for: CHOL, TRIG, HDL, CHOLHDL, LDLCALC, LDLDIRECT  Wt Readings from Last 3 Encounters:  10/16/18 201 lb (91.2 kg)  03/22/17 205 lb (93 kg)  02/22/17 207 lb (93.9 kg)     Objective:    Vital Signs:  There were no vitals taken for this visit.   VITAL SIGNS:  reviewed GEN:  no acute distress RESPIRATORY:  normal respiratory effort, symmetric expansion MUSCULOSKELETAL:  no obvious deformities. NEURO:  alert and oriented x 3, no obvious focal deficit PSYCH:  normal affect  ASSESSMENT & PLAN:    1. Systolic Dysfunction: Abnormal echocardiogram was reduced EF of 40 to 45% with symptoms of generalized fatigue and palpitations.  He denies any chest  pain.  He continues to workout but not over strenuous activity.  I have ordered a coronary CTA to evaluate for coronary artery disease which may be causing reduced blood flow to the myocardium leading to reduced EF.  If cardiac CTA is positive for blockages he will most likely need to be referred to have a cardiac catheterization.  As he is completely pain-free with exercise, will start with a CTA first to determine necessity of moving forward with cath.  The patient will need to follow-up with Dr. Ellyn Hack on next office visit in order to discuss results and make new plans concerning further testing and/or medication adjustments.  2.  Frequent palpitations: Patient has propanolol to take as needed for recurrent frequent palpitations.  He has not had to do so although he does continue to have them they are not sustained or lasting very long.  He is  going to wear cardiac monitor for 3 days.  This will be removed and sent back so that we can evaluate frequency and morphology of his palpitations.  3.  Anxiety: Self-reported and followed by PCP.  The patient takes Ativan as needed.   COVID-19 Education: The signs and symptoms of COVID-19 were discussed with the patient and how to seek care for testing (follow up with PCP or arrange E-visit). The importance of social distancing was discussed today.  Time:   Today, I have spent 20 minutes with the patient with telehealth technology discussing the above problems.  Multiple questions were asked and answered.  The patient verbalizes understanding of planned procedure and follow-up with Dr. Ellyn Hack.   Medication Adjustments/Labs and Tests Ordered: Current medicines are reviewed at length with the patient today.  Concerns regarding medicines are outlined above.   Tests Ordered: Cardiac CTA with FFR.  COVID test and be met  Medication Changes: On-call metoprolol 25 mg x 1 prior to CTA.  Disposition:  Follow up 3-4 weeks.   Signed, Phill Myron. [place] Pugh, ANP, AACC - 10/22/2018 9:11 AM    [place]

## 2018-10-22 NOTE — Telephone Encounter (Signed)
CTA and BMP ordered will go over procedure at virtual visit tomorrow

## 2018-10-22 NOTE — Telephone Encounter (Signed)
-----   Message from Lendon Colonel, NP sent at 10/20/2018 12:51 PM EDT ----- Echocardiogram determined that his heart pumping function is slightly decreased at 40%-45%.  I would like him to have a Cardiac CTA to determine if the reduction in his heart strength is related to lack of blood flow in the coronary arteries. [id number] Dysfunction.

## 2018-10-23 ENCOUNTER — Ambulatory Visit (INDEPENDENT_AMBULATORY_CARE_PROVIDER_SITE_OTHER): Payer: 59

## 2018-10-23 ENCOUNTER — Telehealth (INDEPENDENT_AMBULATORY_CARE_PROVIDER_SITE_OTHER): Payer: 59 | Admitting: Adult Health

## 2018-10-23 ENCOUNTER — Encounter: Payer: Self-pay | Admitting: Adult Health

## 2018-10-23 VITALS — Ht 72.0 in | Wt 201.0 lb

## 2018-10-23 DIAGNOSIS — I519 Heart disease, unspecified: Secondary | ICD-10-CM | POA: Diagnosis not present

## 2018-10-23 DIAGNOSIS — I5189 Other ill-defined heart diseases: Secondary | ICD-10-CM | POA: Insufficient documentation

## 2018-10-23 DIAGNOSIS — R000 Tachycardia, unspecified: Secondary | ICD-10-CM | POA: Diagnosis not present

## 2018-10-23 DIAGNOSIS — R002 Palpitations: Secondary | ICD-10-CM

## 2018-10-23 DIAGNOSIS — R0600 Dyspnea, unspecified: Secondary | ICD-10-CM

## 2018-10-23 MED ORDER — METOPROLOL TARTRATE 25 MG PO TABS: 25.0000 mg | ORAL_TABLET | Freq: Once | ORAL | 0 refills | Status: DC

## 2018-10-23 MED ORDER — NITROGLYCERIN 0.4 MG SL SUBL: 0.4000 mg | SUBLINGUAL_TABLET | SUBLINGUAL | 2 refills | Status: DC | PRN

## 2018-10-23 NOTE — Patient Instructions (Addendum)
Medication Instructions:  Take Metoprolol 25 mg by mouth 2 hour prior to procedure  If you need a refill on your cardiac medications before your next appointment, please call your pharmacy.  Labwork: BMP 1 week prior to CTA HERE IN OUR OFFICE AT [facility]  You will NOT need to fast   Take the provided lab slips with you to the lab for your blood draw.   When you have your labs (blood work) drawn today and your tests are completely normal, you will receive your results only by MyChart Message (if you have MyChart) -OR-  A paper copy in the mail.  If you have any lab test that is abnormal or we need to change your treatment, we will call you to review these results.  Testing/Procedures: Non-Cardiac CT Angiography (CTA), is a special type of CT scan that uses a computer to produce multi-dimensional views of major blood vessels throughout the body. In CT angiography, a contrast material is injected through an IV to help visualize the blood vessels   Special Instructions: Your cardiac CT will be scheduled at one of the below locations:   [facility] [address] [place], [place] [address] [phone]   Please arrive at the [facility] main entrance of [facility] 30-45 minutes prior to test start time. Proceed to the [facility] Radiology Department (first floor) to check-in and test prep.  Please follow these instructions carefully (unless otherwise directed):  Hold all erectile dysfunction medications at least 48 hours prior to test.  On the Night Before the Test: . Be sure to Drink plenty of water. . Do not consume any caffeinated/decaffeinated beverages or chocolate 12 hours prior to your test. . Do not take any antihistamines 12 hours prior to your test. . If you take Metformin do not take 24 hours prior to test.   On the Day of the Test: . Drink plenty of water. Do not drink any water within one hour of the test. . Do not eat any food 4 hours prior to  the test. . You may take your regular medications prior to the test.  . Take metoprolol (Lopressor) two hours prior to test. . HOLD Furosemide/Hydrochlorothiazide morning of the test.   After the Test: . Drink plenty of water. . After receiving IV contrast, you may experience a mild flushed feeling. This is normal. . On occasion, you may experience a mild rash up to 24 hours after the test. This is not dangerous. If this occurs, you can take Benadryl 25 mg and increase your fluid intake. . If you experience trouble breathing, this can be serious. If it is severe call 911 IMMEDIATELY. If it is mild, please call our office. . If you take any of these medications: Glipizide/Metformin, Avandament, Glucavance, please do not take 48 hours after completing test.    Please contact the cardiac imaging nurse navigator should you have any questions/concerns Marchia Bond, RN Navigator Cardiac Imaging [facility] Services [phone] Office  [phone] Cell    Follow-Up: .    Your physician recommends that you schedule a follow-up appointment in: 3 Months with Dr Ellyn Hack   At [facility], you and your health needs are our priority.  As part of our continuing mission to provide you with exceptional heart care, we have created designated Provider Care Teams.  These Care Teams include your primary Cardiologist (physician) and Advanced Practice Providers (APPs -  Physician Assistants and Nurse Practitioners) who all  work together to provide you with the care you need, when you need it.  Thank you for choosing [facility] at [facility]!!

## 2018-10-24 ENCOUNTER — Other Ambulatory Visit: Payer: Self-pay | Admitting: Adult Health

## 2018-10-24 LAB — BASIC METABOLIC PANEL
BUN/Creatinine Ratio: 12 (ref 9–20)
BUN: 12 mg/dL (ref 6–24)
CO2: 24 mmol/L (ref 20–29)
Calcium: 9.5 mg/dL (ref 8.7–10.2)
Chloride: 100 mmol/L (ref 96–106)
Creatinine, Ser: 1.02 mg/dL (ref 0.76–1.27)
GFR calc Af Amer: 97 mL/min/{1.73_m2} (ref >59)
GFR calc non Af Amer: 84 mL/min/{1.73_m2} (ref >59)
Glucose: 74 mg/dL (ref 65–99)
Potassium: 4.7 mmol/L (ref 3.5–5.2)
Sodium: 140 mmol/L (ref 134–144)

## 2018-10-24 MED ORDER — ASPIRIN EC 81 MG PO TBEC: 81.0000 mg | DELAYED_RELEASE_TABLET | Freq: Every day | ORAL | 2 refills | Status: AC

## 2018-10-28 ENCOUNTER — Telehealth (HOSPITAL_COMMUNITY): Payer: Self-pay | Admitting: Emergency Medicine

## 2018-10-28 NOTE — Telephone Encounter (Signed)
Reaching out to patient to offer assistance regarding upcoming cardiac imaging study; pt verbalizes understanding of appt date/time, parking situation and where to check in, pre-test NPO status and medications ordered, and verified current allergies; name and call back number provided for further questions should they arise Shakeisha Horine RN Navigator Cardiac Imaging [place] [facility] [phone] office [phone] cell  Pt denies covid symptoms, verbalized understanding of visitor policy. 

## 2018-10-29 ENCOUNTER — Ambulatory Visit (HOSPITAL_COMMUNITY)
Admission: RE | Admit: 2018-10-29 | Discharge: 2018-10-29 | Disposition: A | Discharge status: Home / Self Care | Payer: 59 | Source: Ambulatory Visit | Attending: Adult Health | Admitting: Adult Health

## 2018-10-29 ENCOUNTER — Other Ambulatory Visit: Payer: Self-pay

## 2018-10-29 DIAGNOSIS — I255 Ischemic cardiomyopathy: Secondary | ICD-10-CM

## 2018-10-29 NOTE — Progress Notes (Signed)
Spoke to MD, Turner about patient taking Cialis 3 days ago. Due to administration of this patient unable to recieve scan today due to nitro needing to be given. Tom Santana notified to be rescheduled.

## 2018-10-30 MED ORDER — METOPROLOL TARTRATE 25 MG PO TABS: 25.0000 mg | ORAL_TABLET | Freq: Once | ORAL | 0 refills | Status: DC

## 2018-10-31 ENCOUNTER — Telehealth (HOSPITAL_COMMUNITY): Payer: Self-pay | Admitting: Emergency Medicine

## 2018-10-31 NOTE — Telephone Encounter (Signed)
Pt requesting updated visitor information. Left VM for callback

## 2018-11-03 ENCOUNTER — Telehealth (HOSPITAL_COMMUNITY): Payer: Self-pay | Admitting: Emergency Medicine

## 2018-11-03 NOTE — Telephone Encounter (Signed)
Pt calling with questions about CCTA and nitro, "do I need to bring my own home nitro for this test?" ; I explained that this something we provide for him and he can leave home meds at home. Pt verbalized understanding. No other questions at this time.   Marchia Bond RN Navigator Cardiac Imaging [facility] V[facility] Services [phone] Office  [phone] Cell

## 2018-11-04 ENCOUNTER — Other Ambulatory Visit: Payer: Self-pay

## 2018-11-04 ENCOUNTER — Ambulatory Visit (HOSPITAL_COMMUNITY)
Admission: RE | Admit: 2018-11-04 | Discharge: 2018-11-04 | Disposition: A | Discharge status: Home / Self Care | Payer: 59 | Source: Ambulatory Visit | Attending: Adult Health | Admitting: Adult Health

## 2018-11-04 DIAGNOSIS — I255 Ischemic cardiomyopathy: Secondary | ICD-10-CM

## 2018-11-04 MED ORDER — NITROGLYCERIN 0.4 MG SL SUBL
SUBLINGUAL_TABLET | SUBLINGUAL | Status: AC
  Filled 2018-11-04: qty 1

## 2018-11-04 MED ORDER — IOHEXOL 350 MG/ML SOLN
80.0000 mL | Freq: Once | INTRAVENOUS | Status: AC | PRN
  Administered 2018-11-04: 80 mL via INTRAVENOUS

## 2018-11-04 MED ORDER — NITROGLYCERIN 0.4 MG SL SUBL
0.8000 mg | SUBLINGUAL_TABLET | Freq: Once | SUBLINGUAL | Status: AC
  Administered 2018-11-04: 0.8 mg via SUBLINGUAL

## 2018-11-06 ENCOUNTER — Telehealth (INDEPENDENT_AMBULATORY_CARE_PROVIDER_SITE_OTHER): Payer: 59 | Admitting: Cardiology

## 2018-11-06 ENCOUNTER — Telehealth: Payer: Self-pay | Admitting: *Deleted

## 2018-11-06 ENCOUNTER — Encounter: Payer: Self-pay | Admitting: Cardiology

## 2018-11-06 VITALS — HR 61 | Ht 72.0 in | Wt 201.0 lb

## 2018-11-06 DIAGNOSIS — R002 Palpitations: Secondary | ICD-10-CM

## 2018-11-06 DIAGNOSIS — I519 Heart disease, unspecified: Secondary | ICD-10-CM | POA: Diagnosis not present

## 2018-11-06 DIAGNOSIS — R079 Chest pain, unspecified: Secondary | ICD-10-CM | POA: Diagnosis not present

## 2018-11-06 DIAGNOSIS — F418 Other specified anxiety disorders: Secondary | ICD-10-CM | POA: Diagnosis not present

## 2018-11-06 NOTE — Telephone Encounter (Signed)
SPOKE TO PATIENT. INSTRUCTION WAS GIVEN FROM TODAY'S VIRTUAL VISIT.  AVS SUMMARY sent via mychart.  PATIENT VERBALIZED UNDERSTANDING.

## 2018-11-06 NOTE — Patient Instructions (Addendum)
Medication Instructions:   Continue to use the propranolol as needed.  Sounds like you have not used it in a long time.  If you do have really fast heart rate spells it is okay to try that as well.  Okay to use Cialis does understand that there may be some the side effects we discussed.   If you need a refill on your cardiac medications before your next appointment, please call your pharmacy.   Lab work: none    Testing/Procedures:  none  Follow-Up: At [facility], you and your health needs are our priority.  As part of our continuing mission to provide you with exceptional heart care, we have created designated Provider Care Teams.  These Care Teams include your primary Cardiologist (physician) and Advanced Practice Providers (APPs -  Physician Assistants and Nurse Practitioners) who all work together to provide you with the care you need, when you need it. . You will need a follow up appointment in 12 months Sept 2021.  Please call our office 2 months in advance to schedule this appointment.  You may see Glenetta Hew, MD or one of the following Advanced Practice Providers on your designated Care Team:   . Rosaria Ferries, PA-C . Jory Sims, DNP, ANP  Any Other Special Instructions Will Be Listed Below (If Applicable).  As we discussed, on the echocardiogram pump function was probably closer to 45% range which correlates relatively well with the Myoview last year saying ejection fraction of 50%.  This is the low end of normal and slightly reduced.  More than likely related to longstanding alcohol use.  This level of reduced pump function should not be the source of any significant change in symptoms.  Things to look out for our shortness of breath laying back flat, waking up at night feeling short of breath, & unexplained shortness of breath while exercising.  Still waiting on the results of your monitor.  Pending any grossly abnormal results, I think were fine with 1 year  follow-up.  Until you hear the results back just do light exercise.  Once we have the results or return if there is no abnormalities you are fine to go back to full exercise all that with the caveat that you ensure that you adequately hydrate drinking water or Gatorade before you exercise and then also having a decent amount of meal leading up to the exercise.

## 2018-11-06 NOTE — Progress Notes (Signed)
Virtual Visit via Video Note   This visit type was conducted due to national recommendations for restrictions regarding the COVID-19 Pandemic (e.g. social distancing) in an effort to limit this patient's exposure and mitigate transmission in our community.  Due to his co-morbid illnesses, this patient is at least at moderate risk for complications without adequate follow up.  This format is felt to be most appropriate for this patient at this time.  All issues noted in this document were discussed and addressed.  A limited physical exam was performed with this format.  Please refer to the patient's chart for his consent to telehealth for [facility].   Patient has given verbal permission to conduct this visit via virtual appointment and to bill insurance 11/09/2018 3:01 PM     Evaluation Performed:  Follow-up visit  Date:  11/09/2018   ID:  Tom Santana, Tom Santana 05-08-1965, MRN [id number]  Patient Location: Home Provider Location: Home  PCP:  [name], MD  Cardiologist:  Glenetta Hew, MD  Electrophysiologist:  None   Chief Complaint: Follow-up - study results  History of Present Illness:    Tom Santana is a 53 y.o. male with no significant PMH  who presents via audio/video conferencing for a telehealth visit today as 1 month follow-up from a telehealth visit with Jory Sims, NP on October 16, 2018.  Tom Santana was last seen by me in February 2019 which was a follow-up visit for chest pain evaluation with nonischemic Myoview.  The plan was for him to follow-up every couple years.  He did have some situational anxiety and so we prescribed as needed propranol, which he indicates he is using relatively infrequently.  But it does help for situational anxiety.   He recently saw Jory Sims, NP for a follow-up visit back on August 27.  Indicated that he been doing relatively well having gone back to the gym.  He was doing treadmill about 10 minutes at a time and then  lifting weights for 20 minutes followed by another 20 minutes on the treadmill.    Apparently he had an episode where he felt quite a bit funny with some lightheadedness and weakness after being on a treadmill.  He felt his heart rate go up into the 118 and 20 beats minute range.  He felt little flushed and tired but no chest pain.  He did not really even note any exertional dyspnea.  He felt like he may have had some anxiety.  A long-term monitor and echocardiogram ordered.-->  Based on echocardiogram results, a coronary CT angiogram was then performed. (reviewed below)   Recent CV studies:   The following studies were reviewed today: . Echo 10/20/2018: EF 40 to 45%.  Appears to be inferior lateral and apical hypokinesis. -->  Coronary CTA ordered. o On my personal review, I did not see inferior HK & felt the EF was more c/w ~45-50% (similar to his Myoview last year) . Monitor ordered, but results not back during the initial part of the visit.  However the study was available that night (essentially normal, no arrhythmias and minimal PACs/PVCs.) . Coronary CTA: Coronary calcium score 0.  Normal coronary arteries.  No evidence of CAD.  Interval History:  Tom Santana is being seen to follow-up of the results of his recent studies.  I personally reviewed the echocardiogram and do not feel that the EF is really is low was 40 to 45%.  More like 45 to 50% and  I did not see any inferior wall motion normality.  He is not having any heart failure symptoms.  We reviewed the results of his coronary CTA.  Unfortunate did not have the monitor results.  He really is not noticing that much in the way of any more these palpitation symptoms now.  He is feeling better overall, and has not had any further episodes where he felt he was in a pass out.  When we talked about potential reasons for his EF being a little bit down, he freely admitted to me that he is a former heavy drinker and heavy smoker.  He is doing  his best he can to quit smoking and is actually currently using electronic cigarette/vapor cigarette while were talking on the video conference.  Interestingly, he really did not have that many symptoms and he was wearing his monitor.  He felt a few palpitations but nothing overly significant.  He still notes that his heart rate will double bit fast when exerting himself, but not significantly.  He is not having any chest pain or pressure.  No syncope or near syncope.  No TIA or amaurosis fugax.  He had lots of questions though about his echocardiogram results.  While talking to him I have reviewed the images personally and reassured him that the EF is relatively similar to his Myoview and that the coronary CTA did not show any evidence of CAD.  This was reassuring for him.  We discussed possible etiologies and the most likely etiology for mildly significant long-term alcohol use.  Thankfully is not having any PND orthopnea.  No edema.  The patient does not have symptoms concerning for COVID-19 infection (fever, chills, cough, or new shortness of breath).  The patient is practicing social distancing.  ROS:  Please see the history of present illness.    Review of Systems  Constitutional: Negative for malaise/fatigue and weight loss.  HENT: Negative for congestion and nosebleeds.   Respiratory: Negative for shortness of breath.   Cardiovascular: Negative for claudication.  Gastrointestinal: Negative for blood in stool, constipation, heartburn and melena.  Genitourinary: Negative for dysuria and hematuria.  Musculoskeletal: Negative for joint pain.  Neurological: Positive for headaches (Off and on). Negative for dizziness.  Psychiatric/Behavioral: The patient is nervous/anxious (Has been very anxious since he started having those weird symptoms, and after he saw the result of the echo.).     Past Medical History:  Diagnosis Date  . Healthy adolescent on routine physical examination    Past  Surgical History:  Procedure Laterality Date  . LEG SUGERY  2007   MRSA INFECTION  . NM MYOVIEW LTD  01/2017   NORMAL/LOW RISK.  EF 51% no regional wall motion normality.  No EKG changes.  Excellent exercise tolerance -13 minutes (15 METs).  No ischemia/infarction  . PYELOPASTY  2003     Current Meds  Medication Sig  . anastrozole (ARIMIDEX) 1 MG tablet Take 1 mg by mouth every other day.   Marland Kitchen aspirin EC 81 MG tablet Take 1 tablet (81 mg total) by mouth daily.  . Chorionic Gonadotropin (HCG) 12000 units SOLR Inject 25 Units as directed 2 (two) times a week.  Marland Kitchen LORazepam (ATIVAN) 0.5 MG tablet Take 0.5 mg by mouth as needed for anxiety.  . nitroGLYCERIN (NITROSTAT) 0.4 MG SL tablet Place 1 tablet (0.4 mg total) under the tongue every 5 (five) minutes as needed for chest pain.  Marland Kitchen propranolol (INDERAL) 10 MG tablet Take 10 mg by mouth  as needed.  . Tadalafil (CIALIS) 2.5 MG TABS Take by mouth daily.   Marland Kitchen testosterone cypionate (DEPOTESTOSTERONE CYPIONATE) 200 MG/ML injection Inject 1 mL into the muscle once a week.  . [DISCONTINUED] propranolol (INDERAL) 20 MG tablet Take 20 mg by mouth 3 (three) times daily.     Allergies:   Patient has no known allergies.   Social History   Tobacco Use  . Smoking status: Former Smoker    Types: Cigarettes    Quit date: 12/05/2016    Years since quitting: 1.9  . Smokeless tobacco: Never Used  Substance Use Topics  . Alcohol use: No    Frequency: Never    Comment: WAS A HEAVY DRINKER IN THE PAST QUIT 09/2015   . Drug use: No     Family Hx: The patient's family history includes Diabetes in his sister; Stroke in his maternal grandmother.   Labs/Other Tests and Data Reviewed:    EKG:  No ECG reviewed.  Recent Labs: 10/24/2018: BUN 12; Creatinine, Ser 1.02; Potassium 4.7; Sodium 140   Recent Lipid Panel No results found for: CHOL, TRIG, HDL, CHOLHDL, LDLCALC, LDLDIRECT  Wt Readings from Last 3 Encounters:  11/06/18 201 lb (91.2 kg)   10/23/18 201 lb (91.2 kg)  10/16/18 201 lb (91.2 kg)     Objective:    Vital Signs:  Pulse 61   Ht 6' (1.829 m)   Wt 201 lb (91.2 kg)   BMI 27.26 kg/m   VITAL SIGNS:  reviewed GEN:  Well-nourished, well-groomed.  Healthy-appearing.  No acute distress.  A little bit anxious RESPIRATORY:  normal respiratory effort, symmetric expansion CARDIOVASCULAR:  No edema MUSCULOSKELETAL:  no obvious deformities. NEURO:  alert and oriented x 3, no obvious focal deficit PSYCH:  Normal mood and affect.  A bit anxious.   ASSESSMENT & PLAN:    Problem List Items Addressed This Visit    Chest pain with low risk for cardiac etiology    He has now had both a Myoview stress test and a coronary CTA confirming lack of any ischemic CAD.  The Myoview did suggest EF of about 50% and this goes along with what we see on the echocardiogram is being low normal to mildly reduced EF.  Not likely related to CAD.  Coronary calcium score is actually 0 which is also very reassuring..  I spent quite a bit of time reassuring him of these results.  Is probably okay for him to continue taking aspirin, but not completely indicated.  He has PRN propranolol, but I would prefer to avoid long-term beta-blocker as long as he is relatively asymptomatic.      Situational anxiety    Relatively well controlled.  Has not had to use any PRN propranolol.  I do think that maybe his episode that he had on the treadmill was just sinus tachycardia, and probably related to being dehydrated or simply inadequately hydrated for the amount of exercise he was doing.  He probably has element of deconditioning.  Would avoid long-term beta-blocker, and continue to use PRN      Systolic dysfunction, left ventricle    Interesting that his EF was down a little bit on the echo.  I reviewed it and honestly I do not see a regional wall motion normality.  The basal inferior wall seems to be moving quite well with just there is some dilation in this  area.  Likely related to the angle of imaging.  I would estimate the EF to  be more like 45-50% which is consistent with what his Myoview showed.  Usually one would think that the Myoview would underestimate EF, but it seems to be accurate in this instance.  With relatively normal coronary arteries calcium score and coronary CTA, I suspect that the most likely etiology for mildly reduced EF is probably his long-term alcohol use.  With no active symptoms of CHF and a relatively preserved EF, I would not necessarily recommend long-term management would be the beta-blocker or ARB unless blood pressure warrants.      Relevant Medications   propranolol (INDERAL) 10 MG tablet   Heart palpitations    He felt his heart rate going fast during exercise and that made him feel flushed.  I suspect that this was probably related to inadequate hydration so we spoke very briefly about making sure that he adequately hydrates prior to exercise both before and during exercise.  We talked proximal way to doing this.  I reviewed his monitor off line after seeing him and there was no signs of any arrhythmias.  We will notify him of the results.  Essentially normal         COVID-19 Education: The signs and symptoms of COVID-19 were discussed with the patient and how to seek care for testing (follow up with PCP or arrange E-visit).   The importance of social distancing was discussed today.  Time:   Today, I have spent 24 minutes with the patient with telehealth technology discussing the above problems.   I spent an additional 10 minutes personally reviewing the results of his echocardiogram, coronary CT angiogram and event monitor both on line with the patient but also prior to the visit for the CT scan and echo, then after the visit with the monitor.   Medication Adjustments/Labs and Tests Ordered: Current medicines are reviewed at length with the patient today.  Concerns regarding medicines are outlined above.    Patient Instructions  Medication Instructions:   Continue to use the propranolol as needed.  Sounds like you have not used it in a long time.  If you do have really fast heart rate spells it is okay to try that as well.  Okay to use Cialis does understand that there may be some the side effects we discussed.   If you need a refill on your cardiac medications before your next appointment, please call your pharmacy.   Lab work: none    Testing/Procedures:  none  Follow-Up: At [facility], you and your health needs are our priority.  As part of our continuing mission to provide you with exceptional heart care, we have created designated Provider Care Teams.  These Care Teams include your primary Cardiologist (physician) and Advanced Practice Providers (APPs -  Physician Assistants and Nurse Practitioners) who all work together to provide you with the care you need, when you need it. . You will need a follow up appointment in 12 months Sept 2021.  Please call our office 2 months in advance to schedule this appointment.  You may see Glenetta Hew, MD or one of the following Advanced Practice Providers on your designated Care Team:   . Rosaria Ferries, PA-C . Jory Sims, DNP, ANP  Any Other Special Instructions Will Be Listed Below (If Applicable).  As we discussed, on the echocardiogram pump function was probably closer to 45% range which correlates relatively well with the Myoview last year saying ejection fraction of 50%.  This is the low end of normal and slightly  reduced.  More than likely related to longstanding alcohol use.  This level of reduced pump function should not be the source of any significant change in symptoms.  Things to look out for our shortness of breath laying back flat, waking up at night feeling short of breath, & unexplained shortness of breath while exercising.  Still waiting on the results of your monitor.  Pending any grossly abnormal results, I think  were fine with 1 year follow-up.  Until you hear the results back just do light exercise.  Once we have the results or return if there is no abnormalities you are fine to go back to full exercise all that with the caveat that you ensure that you adequately hydrate drinking water or Gatorade before you exercise and then also having a decent amount of meal leading up to the exercise.    Signed, Glenetta Hew, MD  11/09/2018 3:01 PM    [place]

## 2018-11-09 ENCOUNTER — Encounter: Payer: Self-pay | Admitting: Cardiology

## 2018-11-09 DIAGNOSIS — R002 Palpitations: Secondary | ICD-10-CM | POA: Insufficient documentation

## 2018-11-09 NOTE — Assessment & Plan Note (Signed)
He has now had both a Myoview stress test and a coronary CTA confirming lack of any ischemic CAD.  The Myoview did suggest EF of about 50% and this goes along with what we see on the echocardiogram is being low normal to mildly reduced EF.  Not likely related to CAD.  Coronary calcium score is actually 0 which is also very reassuring..  I spent quite a bit of time reassuring him of these results.  Is probably okay for him to continue taking aspirin, but not completely indicated.  He has PRN propranolol, but I would prefer to avoid long-term beta-blocker as long as he is relatively asymptomatic.

## 2018-11-09 NOTE — Assessment & Plan Note (Signed)
Interesting that his EF was down a little bit on the echo.  I reviewed it and honestly I do not see a regional wall motion normality.  The basal inferior wall seems to be moving quite well with just there is some dilation in this area.  Likely related to the angle of imaging.  I would estimate the EF to be more like 45-50% which is consistent with what his Myoview showed.  Usually one would think that the Myoview would underestimate EF, but it seems to be accurate in this instance.  With relatively normal coronary arteries calcium score and coronary CTA, I suspect that the most likely etiology for mildly reduced EF is probably his long-term alcohol use.  With no active symptoms of CHF and a relatively preserved EF, I would not necessarily recommend long-term management would be the beta-blocker or ARB unless blood pressure warrants.

## 2018-11-09 NOTE — Assessment & Plan Note (Signed)
He felt his heart rate going fast during exercise and that made him feel flushed.  I suspect that this was probably related to inadequate hydration so we spoke very briefly about making sure that he adequately hydrates prior to exercise both before and during exercise.  We talked proximal way to doing this.  I reviewed his monitor off line after seeing him and there was no signs of any arrhythmias.  We will notify him of the results.  Essentially normal

## 2018-11-09 NOTE — Assessment & Plan Note (Signed)
Relatively well controlled.  Has not had to use any PRN propranolol.  I do think that maybe his episode that he had on the treadmill was just sinus tachycardia, and probably related to being dehydrated or simply inadequately hydrated for the amount of exercise he was doing.  He probably has element of deconditioning.  Would avoid long-term beta-blocker, and continue to use PRN

## 2019-05-05 ENCOUNTER — Other Ambulatory Visit: Payer: Self-pay

## 2019-05-05 ENCOUNTER — Ambulatory Visit (INDEPENDENT_AMBULATORY_CARE_PROVIDER_SITE_OTHER): Payer: 59 | Admitting: Dermatology

## 2019-05-05 DIAGNOSIS — L729 Follicular cyst of the skin and subcutaneous tissue, unspecified: Secondary | ICD-10-CM | POA: Diagnosis not present

## 2019-05-05 DIAGNOSIS — L719 Rosacea, unspecified: Secondary | ICD-10-CM

## 2019-05-05 DIAGNOSIS — D1801 Hemangioma of skin and subcutaneous tissue: Secondary | ICD-10-CM | POA: Diagnosis not present

## 2019-05-05 MED ORDER — MINOCYCLINE HCL 50 MG PO CAPS: 50.0000 mg | ORAL_CAPSULE | Freq: Two times a day (BID) | ORAL | 2 refills | Status: DC

## 2019-05-05 NOTE — Progress Notes (Signed)
   Follow-Up Visit   Subjective  Tom Santana is a 54 y.o. male who presents for the following: Acne (R cheek -bumps x friday-"look terrible" no pain).  Rosacea Location: Central face Duration: 1 lesion right inner cheek has persisted for weeks Quality: Increasing number of central facial bumps Associated Signs/Symptoms: Modifying Factors: History of rosacea Severity:  Timing: Context: Patient has many regular contact with the public and is concerned about the spot on his right cheek  The following portions of the chart were reviewed this encounter and updated as appropriate:     Objective  Well appearing patient in no apparent distress; mood and affect are within normal limits.  All skin waist up examined. Waist up skin examination performed; there are no atypical moles or normal skin skin cancer.  Scattered cherry angiomas.  History of rosacea with possible exacerbation from use of the Covid mask.  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  He will take oral minocycline 50 mg twice daily for 1 to 2 months.  If the cheek lesion does not resolve we will decide at that time on injecting versus taking a small biopsy. Objective  Right Buccal Cheek : 3 mm cystic lesion right inner cheek; this may clear with treating his rosacea, if not he may return for intralesional injection.  Objective  Left Alar Crease, Right Ala Nasi: Centrifacial erythema with papules/pustules.   Assessment & Plan  Cyst of skin Right Buccal Cheek   Rosacea (2) Left Alar Crease; Right Ala Nasi

## 2019-07-01 ENCOUNTER — Ambulatory Visit: Payer: 59 | Admitting: Dermatology

## 2019-09-21 ENCOUNTER — Encounter: Payer: Self-pay | Admitting: Cardiology

## 2019-09-21 ENCOUNTER — Other Ambulatory Visit: Payer: Self-pay

## 2019-09-21 ENCOUNTER — Ambulatory Visit (INDEPENDENT_AMBULATORY_CARE_PROVIDER_SITE_OTHER): Payer: 59 | Admitting: Cardiology

## 2019-09-21 VITALS — BP 118/84 | HR 52 | Ht 72.0 in | Wt 212.2 lb

## 2019-09-21 DIAGNOSIS — R002 Palpitations: Secondary | ICD-10-CM | POA: Diagnosis not present

## 2019-09-21 DIAGNOSIS — R000 Tachycardia, unspecified: Secondary | ICD-10-CM | POA: Diagnosis not present

## 2019-09-21 NOTE — Patient Instructions (Addendum)
Medication Instructions:  No changes *If you need a refill on your cardiac medications before your next appointment, please call your pharmacy*   Lab Work: Not needed   Testing/Procedures: Not needed   Follow-Up: At [facility], you and your health needs are our priority.  As part of our continuing mission to provide you with exceptional heart care, we have created designated Provider Care Teams.  These Care Teams include your primary Cardiologist (physician) and Advanced Practice Providers (APPs -  Physician Assistants and Nurse Practitioners) who all work together to provide you with the care you need, when you need it.     Your next appointment:   2 year(s) Aug 2023  The format for your next appointment:   In Person  Provider:   Glenetta Hew, MD   Other Instructions

## 2019-09-21 NOTE — Progress Notes (Signed)
Primary Care Provider: [name], MD Cardiologist: No primary care provider on file. Electrophysiologist: None  Clinic Note: No chief complaint on file.  HPI:    Tom Santana is a 54 y.o. male with a PMH below who presents today for episodes of fast HR associated with lower levels of exercise.  Tom Santana was last seen in September 2020 via telemedicine.  This is to review the results of his echo and coronary CTA.  Coronary CTA was essentially normal.  Echo suggested mildly reduced EF with regional wall motion normality that I did not personally see.  Estimated EF of roughly 45 to 50%. --> None medication adjustments made  Recent Hospitalizations: None  Reviewed  CV studies:    The following studies were reviewed today: (if available, images/films reviewed: From Epic Chart or Care Everywhere) . None:   Interval History:   Tom Santana is here because of concerning episodes of heart rate going fast that is not in a normal situation for him.  He says he tries exercise about about 30 to 45 minutes a day almost every day both cardio and weight training.  When nurses that his heart rate just stays fast and it does not reduce back to baseline for like at least 58minutes.  The heart rate also goes up a lot faster to well over the 120 -130 bpm range then before.  He denies any real associated chest pain or pressure but does get a little bit lightheaded with it.  No dyspnea.  When I questioned him about how he prepares for his exercise, he does not necessarily drink a lot of water during the course the day and does not take them on the consideration.  CV Review of Symptoms (Summary) Cardiovascular ROS: no chest pain or dyspnea on exertion positive for - palpitations, rapid heart rate and Tachycardia associated with some lightheadedness, but no syncope or near syncope. negative for - edema, irregular heartbeat, orthopnea, paroxysmal nocturnal dyspnea, shortness of breath or  TIA/amaurosis fugax, claudication  The patient does not have symptoms concerning for COVID-19 infection (fever, chills, cough, or new shortness of breath).  The patient is practicing social distancing & Masking.    REVIEWED OF SYSTEMS   ROS   I have reviewed and (if needed) personally updated the patient's problem list, medications, allergies, past medical and surgical history, social and family history.   PAST MEDICAL HISTORY   Past Medical History:  Diagnosis Date  . Healthy adolescent on routine physical examination     PAST SURGICAL HISTORY   Past Surgical History:  Procedure Laterality Date  . LEG SUGERY  2007   MRSA INFECTION  . NM MYOVIEW LTD  01/2017   NORMAL/LOW RISK.  EF 51% no regional wall motion normality.  No EKG changes.  Excellent exercise tolerance -13 minutes (15 METs).  No ischemia/infarction  . PYELOPASTY  2003    MEDICATIONS/ALLERGIES   Current Meds  Medication Sig  . anastrozole (ARIMIDEX) 1 MG tablet Take 1 mg by mouth every other day.   Marland Kitchen aspirin EC 81 MG tablet Take 1 tablet (81 mg total) by mouth daily.  . Chorionic Gonadotropin (HCG) 12000 units SOLR Inject 25 Units as directed 2 (two) times a week.  Marland Kitchen LORazepam (ATIVAN) 0.5 MG tablet Take 0.5 mg by mouth as needed for anxiety.  . nitroGLYCERIN (NITROSTAT) 0.4 MG SL tablet Place 1 tablet (0.4 mg total) under the tongue every 5 (five) minutes as needed for chest pain.  Marland Kitchen  Tadalafil (CIALIS) 2.5 MG TABS Take by mouth daily.   Marland Kitchen testosterone cypionate (DEPOTESTOSTERONE CYPIONATE) 200 MG/ML injection Inject 1 mL into the muscle once a week.    No Known Allergies  SOCIAL HISTORY/FAMILY HISTORY   Reviewed in Epic:  Pertinent findings: He has actually started trying to exercise more.  OBJCTIVE -PE, EKG, labs   Wt Readings from Last 3 Encounters:  09/21/19 212 lb 3.2 oz (96.3 kg)  11/06/18 201 lb (91.2 kg)  10/23/18 201 lb (91.2 kg)    Physical Exam: BP 118/84   Pulse (!) 52   Ht 6'  (1.829 m)   Wt 212 lb 3.2 oz (96.3 kg)   SpO2 98%   BMI 28.78 kg/m  Physical Exam Constitutional:      Appearance: Normal appearance. He is not ill-appearing.  HENT:     Head: Normocephalic and atraumatic.  Cardiovascular:     Rate and Rhythm: Regular rhythm. Bradycardia present.  No extrasystoles are present.    Chest Wall: PMI is not displaced.     Pulses: Normal pulses.     Heart sounds: Normal heart sounds. No murmur heard.  No friction rub. No gallop.      Comments: Cannot exclude split s2. Neurological:     General: No focal deficit present.     Mental Status: He is alert and oriented to person, place, and time.  Psychiatric:        Mood and Affect: Mood normal.        Behavior: Behavior normal.        Thought Content: Thought content normal.        Judgment: Judgment normal.      Adult ECG Report  Rate: 52 ;  Rhythm: sinus bradycardia and Left axis deviation (-39 ); incomplete RBBB.  Otherwise stable;   Narrative Interpretation:  stable EKG  Recent Labs:    No results found for: CHOL, HDL, LDLCALC, LDLDIRECT, TRIG, CHOLHDL Lab Results  Component Value Date   CREATININE 1.02 10/24/2018   BUN 12 10/24/2018   NA 140 10/24/2018   K 4.7 10/24/2018   CL 100 10/24/2018   CO2 24 10/24/2018   No results found for: TSH  ASSESSMENT/PLAN   Tom Santana discussed exertional tachycardia.  It sounds that he basically has sinus tachycardia unexpectedly with exercise, but it the rates are faster and last longer..  --> He has resting bradycardia so I do not think that he has a true arrhythmia.  My suspicion is that he simply is not adequately hydrating for his exercise level.  I recommend that he adequately hydrate take thing less water a day but more than that prior to exercise.  Mostly reassured him.   Problem List Items Addressed This Visit    None    Visit Diagnoses    Rapid heart rate    -  Primary   Relevant Orders   EKG 12-Lead (Completed)   Palpitations        Relevant Orders   EKG 12-Lead (Completed)       COVID-19 Education: The signs and symptoms of COVID-19 were discussed with the patient and how to seek care for testing (follow up with PCP or arrange E-visit).   The importance of social distancing and COVID-19 vaccination was discussed today.  I spent a total of 28 minutes with the patient. >  50% of the time was spent in direct patient consultation.  --> Quite detailed explanation of symptoms and multiple questions discussed.  I also spent some time explaining the pathophysiology of sinus tachycardia in the setting of dehydration.  We discussed importance of hydration out of probably do so.  We discussed exercise physiology.  Additional time spent with chart review  / charting (studies, outside notes, etc): 6 Total Time: 34 min   Current medicines are reviewed at length with the patient today.  (+/- concerns) n/a  Notice: This dictation was prepared with Dragon dictation along with smaller phrase technology. Any transcriptional errors that result from this process are unintentional and may not be corrected upon review.  Patient Instructions / Medication Changes & Studies & Tests Ordered   Patient Instructions  Medication Instructions:  No changes *If you need a refill on your cardiac medications before your next appointment, please call your pharmacy*   Lab Work: Not needed   Testing/Procedures: Not needed   Follow-Up: At [facility], you and your health needs are our priority.  As part of our continuing mission to provide you with exceptional heart care, we have created designated Provider Care Teams.  These Care Teams include your primary Cardiologist (physician) and Advanced Practice Providers (APPs -  Physician Assistants and Nurse Practitioners) who all work together to provide you with the care you need, when you need it.     Your next appointment:   2 year(s) Aug 2023  The format for your next appointment:     In Person  Provider:   Glenetta Hew, MD   Other Instructions     Studies Ordered:   Orders Placed This Encounter  Procedures  . EKG 12-Lead     Glenetta Hew, M.D., M.S. Interventional Cardiologist   Pager # [phone] Phone # [phone] [address]. [place], [place] [address]   Thank you for choosing [facility] at [facility]!!

## 2020-02-20 DIAGNOSIS — U071 COVID-19: Secondary | ICD-10-CM

## 2020-02-20 HISTORY — DX: COVID-19: U07.1

## 2020-03-09 DIAGNOSIS — U071 COVID-19: Secondary | ICD-10-CM

## 2020-03-11 ENCOUNTER — Telehealth: Payer: Self-pay | Admitting: Physician Assistant

## 2020-03-11 NOTE — Telephone Encounter (Signed)
Called to discuss with patient about Covid symptoms and the use of a monoclonal antibody infusion for those with mild to moderate Covid symptoms and at a high risk of hospitalization.   Onset 03/08/20. He is non vaccinated for COVID.  Risk Factors: BMI of 29. PRior tobacco smoker, currently uses vapor. Tier 2    Pt is qualified for the monoclonal antibody infusion, but due to medication shortages we cannot offer the pt the infusion at this time. This decision was based on clinical judgement as well as the the NIH Covid 19 treatment guidelines for treatment prioritization and equitable access. Symptoms tier reviewed as well as criteria for ending isolation. Preventative practices reviewed. Patient verbalized understanding.   Patient will give [place] call back if worsening of symptoms.  [place], [place]  03/11/2020 2:44 PM

## 2020-04-05 ENCOUNTER — Other Ambulatory Visit: Payer: Self-pay | Admitting: Family Medicine

## 2020-04-05 DIAGNOSIS — R109 Unspecified abdominal pain: Secondary | ICD-10-CM

## 2020-04-21 ENCOUNTER — Other Ambulatory Visit: Payer: Self-pay

## 2020-04-21 ENCOUNTER — Ambulatory Visit
Admission: RE | Admit: 2020-04-21 | Discharge: 2020-04-21 | Disposition: A | Discharge status: Home / Self Care | Payer: 59 | Source: Ambulatory Visit | Attending: Family Medicine | Admitting: Family Medicine

## 2020-04-21 DIAGNOSIS — R109 Unspecified abdominal pain: Secondary | ICD-10-CM

## 2020-04-21 MED ORDER — IOPAMIDOL (ISOVUE-300) INJECTION 61%
100.0000 mL | Freq: Once | INTRAVENOUS | Status: AC | PRN
  Administered 2020-04-21: 100 mL via INTRAVENOUS

## 2020-12-19 ENCOUNTER — Encounter: Payer: Self-pay | Admitting: *Deleted

## 2020-12-20 ENCOUNTER — Encounter: Payer: Self-pay | Admitting: Neurology

## 2020-12-20 ENCOUNTER — Ambulatory Visit (INDEPENDENT_AMBULATORY_CARE_PROVIDER_SITE_OTHER): Payer: 59 | Admitting: Neurology

## 2020-12-20 VITALS — BP 124/85 | HR 79 | Ht 72.0 in | Wt 213.4 lb

## 2020-12-20 DIAGNOSIS — R251 Tremor, unspecified: Secondary | ICD-10-CM | POA: Diagnosis not present

## 2020-12-20 DIAGNOSIS — G479 Sleep disorder, unspecified: Secondary | ICD-10-CM | POA: Diagnosis not present

## 2020-12-20 NOTE — Patient Instructions (Signed)
You have a rather mild and intermittent tremor of the left hand, noticeable only with a certain position of your left hand, no other tremors noted. I do not see any signs or symptoms of parkinson's like disease or what we call parkinsonism.   For your tremor, I would not recommend any new medications nature of your symptoms.  We can certainly monitor your symptoms and examination and arrange for checkup next year.  As discussed, we will proceed with a brain scan, called MRI and call you with the test results. We will have to schedule you for this on a separate date. This test requires authorization from your insurance, and we will take care of the insurance process.  Please remember, that any kind of tremor may be exacerbated by anxiety, anger, nervousness, excitement, thyroid disease, dehydration, sleep deprivation, by caffeine, and low blood sugar values or blood sugar fluctuations. Some medications can exacerbate tremors, this includes certain antidepressant medications.  You may be at risk for sleep apnea based on a somewhat crowded airway and your history of snoring and restless sleep.  Please also talk to your wife about your snoring and if she has noticed any pauses in your breathing while you are asleep.  Please consider getting checked out for sleep apnea with a sleep test, we can consider a home sleep test if you would prefer.

## 2020-12-20 NOTE — Progress Notes (Signed)
Subjective:    Patient ID: Tom Santana is a 55 y.o. male.  HPI    Star Age, MD, PhD [facility] [address], [address] [place], [place] [address]  Dear Dr. Orland Mustard,   I saw your patient, Tom Santana, upon your kind request in my neurologic clinic today for initial consultation of his tremor.  The patient is unaccompanied today.  As you know, [name] is a 55 year old right-handed gentleman with an underlying medical history of systolic dysfunction, aortic atherosclerosis, low testosterone, anxiety, depression, reflux disease, and overweight state, who reports a hand tremor for the past few years.  He first noticed it some 3 to 4 years ago, it was very rare and intermittent at the time.  In the past approximately 1 year he has noticed the tremor more consistently.  It is only in the left hand with certain position, he can change in position to alleviate the tremor.  He has not noticed any telltale triggers or alleviating factors but does notice the tremor to be worse when he is tired or stressed out.  Does have intermittent anxiety for which he takes Ativan in low-dose rarely.  He does not drink any alcohol.  He does drink caffeine in the form of coffee.  He, estimates that he drinks about 500 or 600 mg of coffee per day but this is not a new occurrence.  He quit smoking cigarettes some 5 years ago and now vapes nicotine.  He lives with his wife.  He does snore and has some sleep disruption, some restless sleep according to his sleep number bed.  He has not twice per night, he has never had a sleep study. He does not endorse a family history of tremors.  He has 2 sisters.  Mom is 43 and has Alzheimer's dementia.  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  While he finds this embarrassing, he has not had any impairment in his work with the tremor.  He is right-handed.  I reviewed your  office note from 04/11/2020.  He had blood work through your office and I was able to review results from 03/29/2020.  CBC with differential was unremarkable with minor changes including mildly elevated lymphocyte count.  CMP showed glucose of 75, BUN 10, creatinine 1.07, sodium 140, potassium 3.9.  Alk phos 162, ALT 23, AST 20.  ESR was less than 1, lipase was 53, amylase was 57 on 01/01/2020.  T4 and TSH were checked on 06/11/2019 and normal.  A1c was 5.5 on 05/01/2019.   His Past Medical History Is Significant For: Past Medical History:  Diagnosis Date   Anxiety    COVID-19 02/2020   Healthy adolescent on routine physical examination    Low testosterone    UPJ (ureteropelvic junction) obstruction     His Past Surgical History Is Significant For: Past Surgical History:  Procedure Laterality Date   herniated disc     04/2020 EPSI   LEG SUGERY  2007   MRSA INFECTION   NM MYOVIEW LTD  01/2017   NORMAL/LOW RISK.  EF 51% no regional wall motion normality.  No EKG changes.  Excellent exercise tolerance -13 minutes (15 METs).  No ischemia/infarction   PYELOPASTY  2003    His Family History Is Significant For: Family History  Problem Relation Age of Onset   Dementia Mother    Diabetes Sister    Stroke Maternal  Grandmother    Tremor Neg Hx     His Social History Is Significant For: Social History   Socioeconomic History   Marital status: Married    Spouse name: Not on file   Number of children: Not on file   Years of education: Not on file   Highest education level: Not on file  Occupational History   Not on file  Tobacco Use   Smoking status: Former    Types: Cigarettes    Quit date: 12/05/2016    Years since quitting: 4.0   Smokeless tobacco: Never  Vaping Use   Vaping Use: Every day   Start date: 12/05/2016  Substance and Sexual Activity   Alcohol use: No    Comment: WAS A HEAVY DRINKER IN THE PAST QUIT 09/2015    Drug use: No   Sexual activity: Not on file  Other  Topics Concern   Not on file  Social History Narrative    he is married , 2 kids grown)   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   He is a former heavy smoker, but is down to less than 5 a day.  Was also former heavy drinker from ages [age].  Now minimal.   He tries to exercise about 6 days a week for about an hour either weightlifting bicycling or walking/running on the treadmill.   Social Determinants of Health   Financial Resource Strain: Not on file  Food Insecurity: Not on file  Transportation Needs: Not on file  Physical Activity: Not on file  Stress: Not on file  Social Connections: Not on file    His Allergies Are:  No Known Allergies:   His Current Medications Are:  Outpatient Encounter Medications as of 12/20/2020  Medication Sig   anastrozole (ARIMIDEX) 1 MG tablet Take 1 mg by mouth every other day.    clomiPHENE (CLOMID) 50 MG tablet Take 50 mg by mouth daily.   LORazepam (ATIVAN) 0.5 MG tablet Take 0.5 mg by mouth as needed for anxiety.   Tadalafil 2.5 MG TABS Take by mouth daily.    testosterone cypionate (DEPOTESTOSTERONE CYPIONATE) 200 MG/ML injection Inject 1 mL into the muscle once a week.   Chorionic Gonadotropin (HCG) 12000 units SOLR Inject 25 Units as directed 2 (two) times a week.   [DISCONTINUED] nitroGLYCERIN (NITROSTAT) 0.4 MG SL tablet Place 1 tablet (0.4 mg total) under the tongue every 5 (five) minutes as needed for chest pain.   No facility-administered encounter medications on file as of 12/20/2020.  :   Review of Systems:  Out of a complete 14 point review of systems, all are reviewed and negative with the exception of these symptoms as listed below:  Review of Systems  Neurological:        L thumb tremor for years,worsening over time.  Worse in different position.  Can affect wrist.    Objective:  Neurological Exam  Physical Exam Physical Examination:   Vitals:   12/20/20 0818  BP: 124/85  Pulse: 79     General Examination: The patient is a very pleasant 55 y.o. male in no acute distress. He appears well-developed and well-nourished and well groomed.   HEENT: Normocephalic, atraumatic, pupils are equal, round and reactive, extraocular tracking well-preserved, hearing grossly intact with tuning fork.  Face is symmetric with normal facial animation and normal facial sensation to light touch, temperature and vibration.  Speech is clear without dysarthria, hypophonia or voice tremor.  He has  no lip, neck or jaw tremor.  Neck is supple with full range of motion, no carotid bruits. Oropharynx exam reveals: mild mouth dryness, good dental hygiene and moderate airway crowding, due to small airway entry, somewhat wider uvula, tonsils on the smaller side.  Mallampati class II.  Tongue protrudes centrally and palate elevates symmetrically.   Chest: Clear to auscultation without wheezing, rhonchi or crackles noted.  Heart: S1+S2+0, regular and normal without murmurs, rubs or gallops noted.   Abdomen: Soft, non-tender and non-distended.  Extremities: There is no pitting edema in the distal lower extremities bilaterally.   Skin: Warm and dry without trophic changes noted.   Musculoskeletal: exam reveals no obvious joint deformities.   Neurologically:  Mental status: The patient is awake, alert and oriented in all 4 spheres. His immediate and remote memory, attention, language skills and fund of knowledge are appropriate. There is no evidence of aphasia, agnosia, apraxia or anomia. Speech is clear with normal prosody and enunciation. Thought process is linear. Mood is normal and affect is normal.  Cranial nerves II - XII are as described above under HEENT exam.  Motor exam: Normal bulk, strength and tone is noted, in particular, no rigidity or cogwheeling. There is no resting tremor.  On 12/20/2020: 20 He has mild insecurity with the right hand but no trembling, very mild trembling with the left hand,  handwriting with the right hand is legible, not particularly tremulous or micrographic. He has a very slight and intermittent trembling in the left thumb area with holding the left hand and wrist slightly flexed, fingers slightly flexed.  No other postural or action tremor noted except for a slight action tremor of the left hand, no significant intention tremor, no resting tremor in the lower extremities.  No right hand tremor.  Romberg is negative. Reflexes are 2+ throughout, toes are downgoing. Fine motor skills and coordination: Intact with finger taps, hand movements, rapid alternating patting and foot taps bilaterally, symmetrical, no decrement in amplitude noted.    Cerebellar testing: No dysmetria or intention tremor. There is no truncal or gait ataxia.  Finger-to-nose and heel-to-shin benign bilaterally and symmetrical. Sensory exam: intact to light touch, temperature and vibration sense in both upper and lower extremities.  Gait, station and balance: He stands easily. No veering to one side is noted. No leaning to one side is noted. Posture is age-appropriate and stance is narrow based. Gait shows normal stride length and normal pace. No problems turning are noted. Tandem walk is unremarkable, after initial challenge.  Preserved arm swing noted, no shuffling.                Assessment and Plan:  In summary, JAYVYN HASELTON is a very pleasant 55 y.o.-year old with an underlying medical history of systolic dysfunction, aortic atherosclerosis, low testosterone, anxiety, depression, reflux disease, and overweight state, who presents for evaluation of his left hand tremor of several years duration.  It was initially intermittent but has been more consistent in the past year.  He is not impaired by the tremor but it does bother him when he is with clients.  He has a very slight and intermittent tremor in the left thumb area only today on examination.  No telltale resting tremor, no parkinsonism, history  and exam not supportive of essential tremor either.  I do not have a good explanation for his intermittent tremor.  He does report having had surgery to the left for cyst removal several years ago and as  per his recollection the tremor started about 3 months after.  Nevertheless, presentation is benign.  We did talk about further work-up.  He has had regular blood work through your office, we mutually agreed to pursue a brain MRI with and without contrast at this time to rule out a structural cause of his tremor.  We will call him with his results.  We can certainly monitor symptoms and exam at this time and he is advised to make a follow-up appointment routinely in this clinic in 1 year.  We talked about tremor triggers and I advised him that sleep deprivation, dehydration stress, caffeine, and certain medications, thyroid dysfunction can be common triggers for tremor exacerbation.  He has not changed his caffeine habits.  He tries to adhere to good sleep hygiene.  Nevertheless, he may be at some risk for sleep apnea given his history of snoring and restless sleep, he does have nocturia as well.  He has a somewhat chronic moderate airway as well.  He is advised to have his wife commented on his snoring and breathing, we can certainly consider a sleep study at some point.  We talked about obstructive sleep apnea and treatment with a CPAP or AutoPap machine.  He is agreeable to think about it.  For now, we will keep him posted as to his MRI results by phone call and plan to follow-up for a recheck in 1 year.  I answered all his questions today and he was in agreement.   Thank you very much for allowing me to participate in the care of this nice patient. If I can be of any further assistance to you please do not hesitate to call me at [phone].  Sincerely,   Star Age, MD, PhD

## 2020-12-22 ENCOUNTER — Ambulatory Visit: Payer: 59 | Admitting: Neurology

## 2020-12-22 ENCOUNTER — Telehealth: Payer: Self-pay | Admitting: Neurology

## 2020-12-22 NOTE — Telephone Encounter (Signed)
MR Brain w/wo contrast Dr. Rexene Alberts [facility] Josem Kaufmann: [place] via [facility] website. Patient is scheduled at [facility] for 02/01/21.

## 2021-01-24 ENCOUNTER — Telehealth: Payer: Self-pay | Admitting: Dermatology

## 2021-01-24 NOTE — Telephone Encounter (Signed)
Cut on forehead about 5 days before thanksgiving on the lift gate of wife's care. He was heading out of town at the time and it has healed up great but patient wants to know what he can use to minimize the scar. He states that it about 3/4 of an inch and he has no concerns about healing.

## 2021-01-25 NOTE — Telephone Encounter (Signed)
Patient is returning phone call about what he can do about the scar.

## 2021-01-25 NOTE — Telephone Encounter (Signed)
Phone call to patient with Dr. Tafeen's recommendations.  Patient aware. 

## 2021-01-25 NOTE — Telephone Encounter (Signed)
Phone call to patient with Dr. Tafeen's recommendations. Voicemail left for patient to give the office a call back.  

## 2021-02-01 ENCOUNTER — Ambulatory Visit (INDEPENDENT_AMBULATORY_CARE_PROVIDER_SITE_OTHER): Payer: 59

## 2021-02-01 DIAGNOSIS — R251 Tremor, unspecified: Secondary | ICD-10-CM

## 2021-02-01 MED ORDER — GADOBENATE DIMEGLUMINE 529 MG/ML IV SOLN
15.0000 mL | Freq: Once | INTRAVENOUS | Status: AC | PRN
  Administered 2021-02-01: 12:00:00 15 mL via INTRAVENOUS

## 2021-02-06 ENCOUNTER — Telehealth: Payer: Self-pay | Admitting: *Deleted

## 2021-02-06 NOTE — Telephone Encounter (Signed)
Called pt and LVM (ok per DPR) with MRI brain results in detail as noted below by Dr Rexene Alberts. Included the findings as well as recommendations for keeping BP, cholesterol, blood sugar, and weight under control. Left office number in message for call back if he has any questions. Reminded pt of November 2023 appointment.

## 2021-02-06 NOTE — Telephone Encounter (Signed)
-----   Message from Star Age, MD sent at 02/02/2021  4:29 PM EST ----- Please call patient regarding the recent brain MRI: The brain scan showed a normal structure of the brain and no significant volume loss or what we call atrophy. There were changes in the deeper structures of the brain, which we call white matter changes or microvascular changes. These were reported as mild to moderate in his case. These are tiny white spots, that occur with time and are seen in a variety of conditions, including with normal aging, chronic hypertension, chronic headaches, especially migraine HAs, chronic diabetes, chronic hyperlipidemia. These are not strokes and no mass or lesion or contrast enhancement was seen which is reassuring. Again, there were no acute findings, such as a stroke, or mass or blood products. No further action is required on this test at this time, other than re-enforcing the importance of good blood pressure control, good cholesterol control, good blood sugar control, and weight management. Please remind patient to keep any upcoming appointments or tests and to call us with any interim questions, concerns, problems or updates. Thanks,  Star Age, MD, PhD

## 2021-06-26 IMAGING — CT CT CHEST WITH CONTRAST
2 of 4 series · 12 of 36 positions shown, 15 images · IV contrast (agent unspecified)
Comparison: None.

CLINICAL DATA: History of heavy tobacco use with chest tightness

EXAM:
CT CHEST WITH CONTRAST
TECHNIQUE: Multidetector CT imaging of the chest was performed during
intravenous contrast administration.
CONTRAST:  75mL [id number]

[Series 2: chest 2.00 br40 s3 · axial · 0.62mm/px · z∈[+1366,+1650]mm · 9 of 168 slices shown, 12 images (1 of 2)]
[im 13/168  mediastinal]
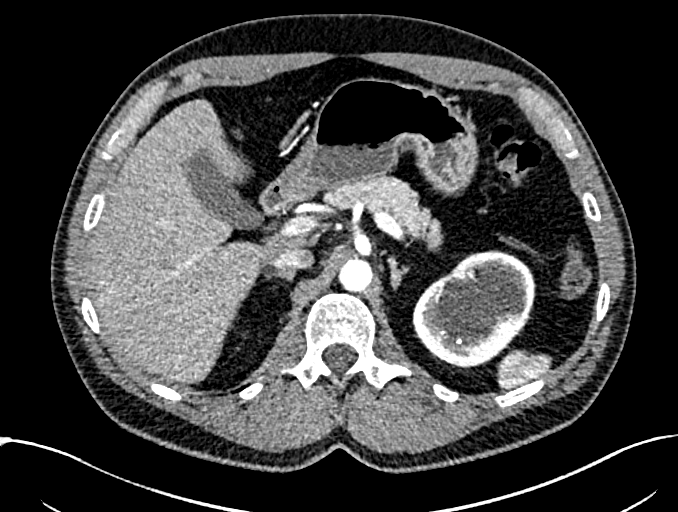
[im 13/168  lung]
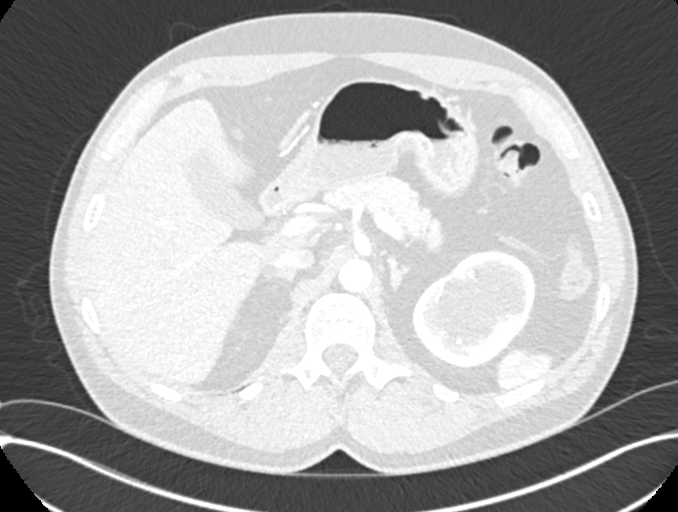
[im 39/168  lung]
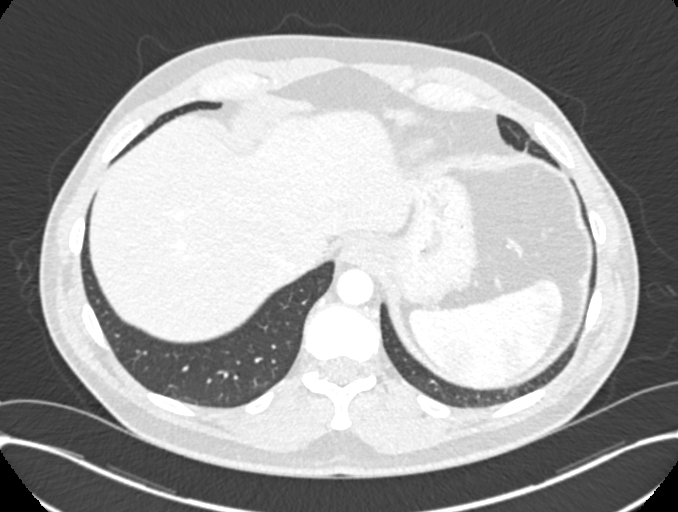
[im 52/168  lung]
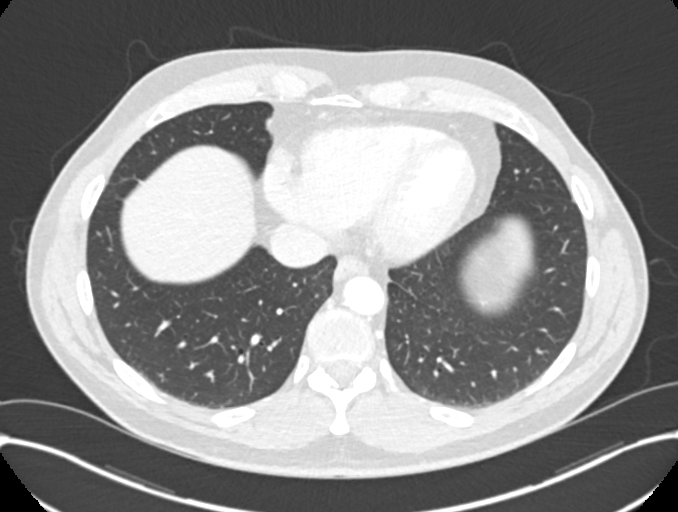
[im 65/168  lung]
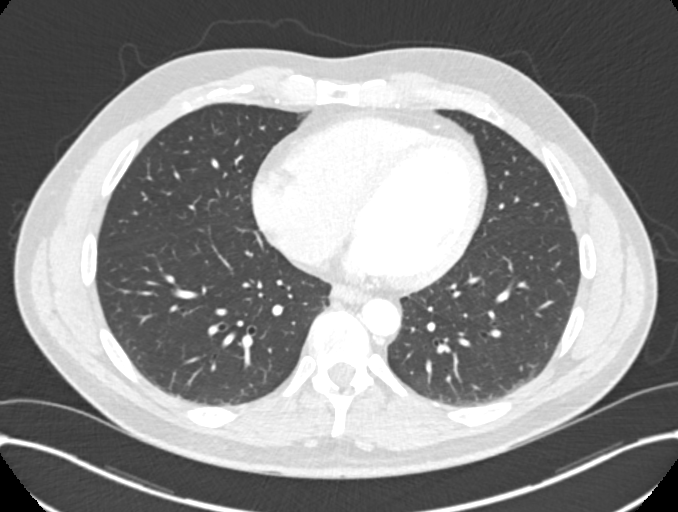
[im 90/168  mediastinal]
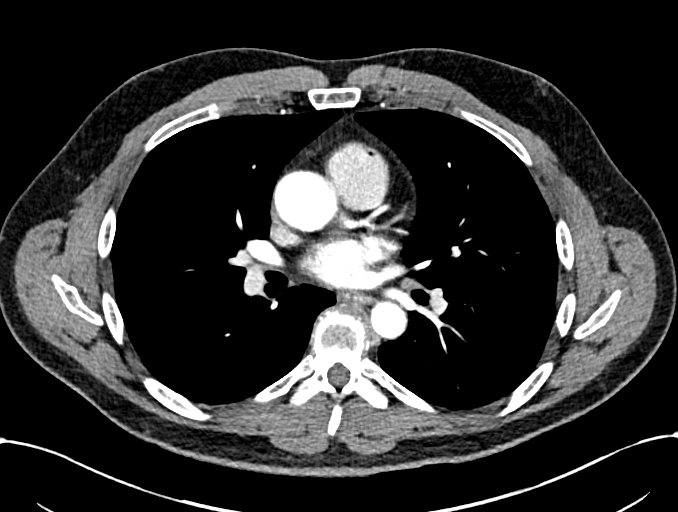
[im 90/168  lung]
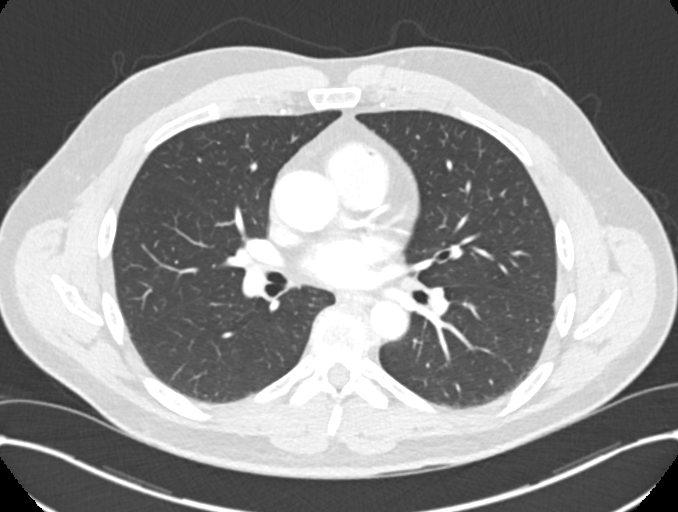
[im 103/168  lung]
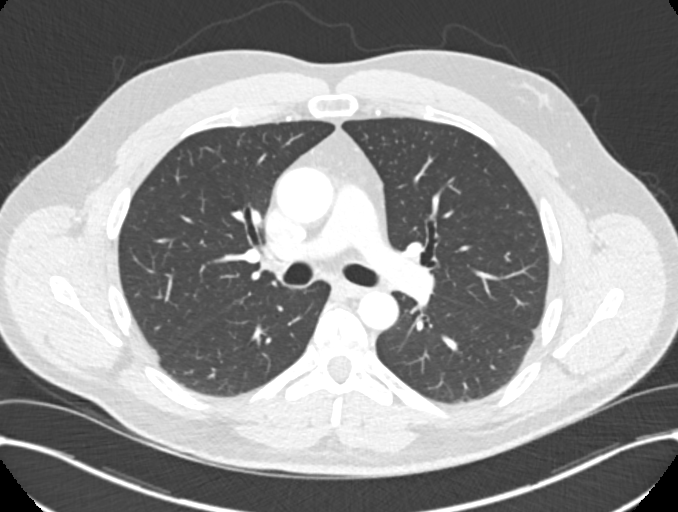
[im 116/168  lung]
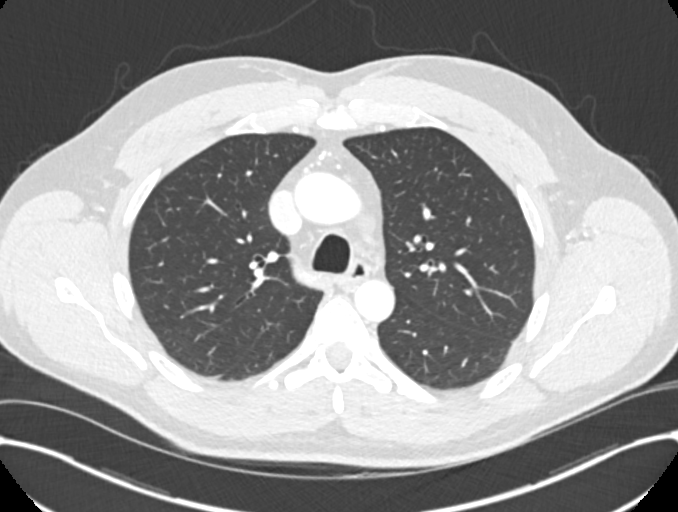
[im 142/168  lung]
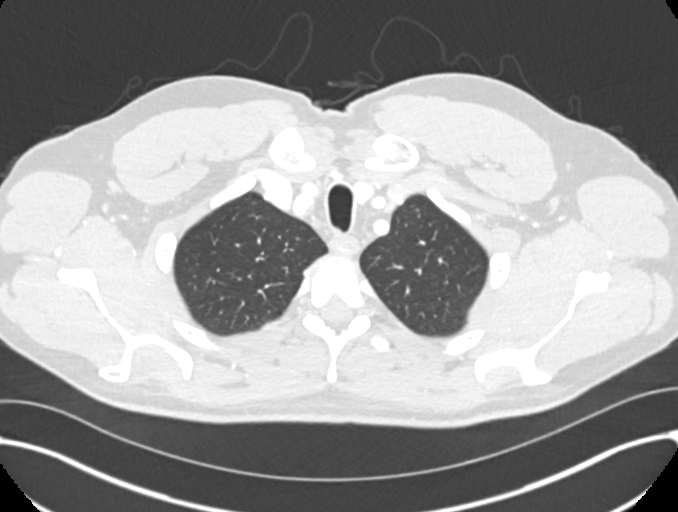
[im 155/168  mediastinal]
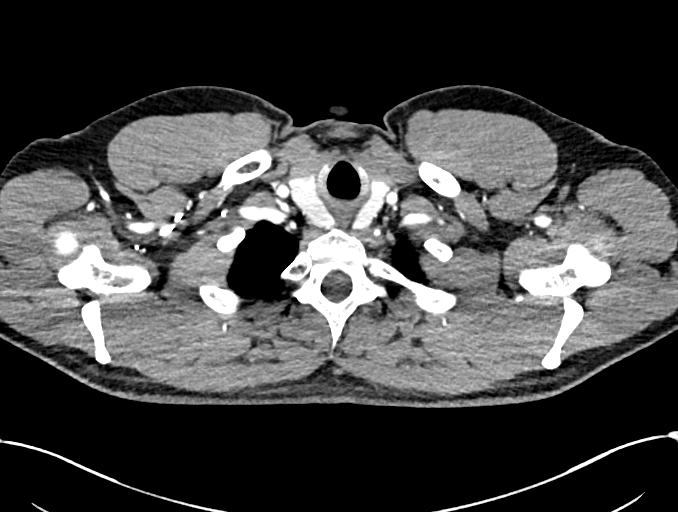
[im 155/168  lung]
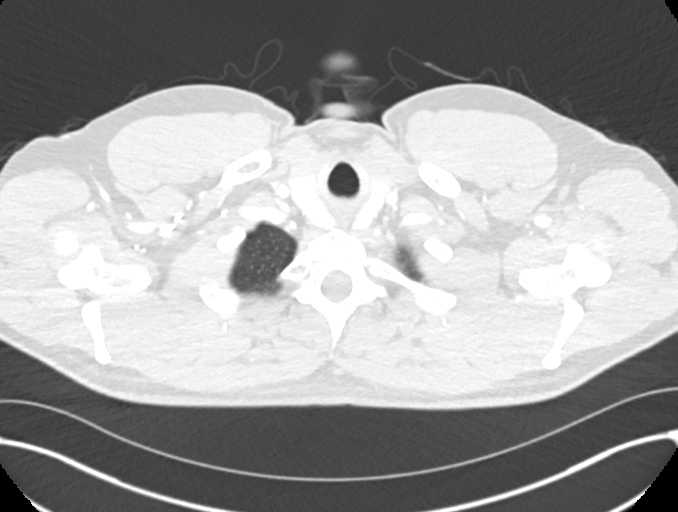

[Series 4: chest 2.00 br40 s3 · coronal · 0.66mm/px · 3 of 158 slices shown (2 of 2)]
[im 32/158  lung]
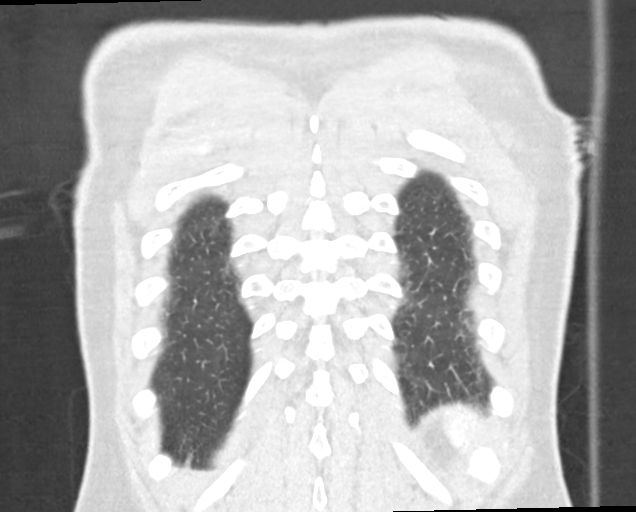
[im 63/158  lung]
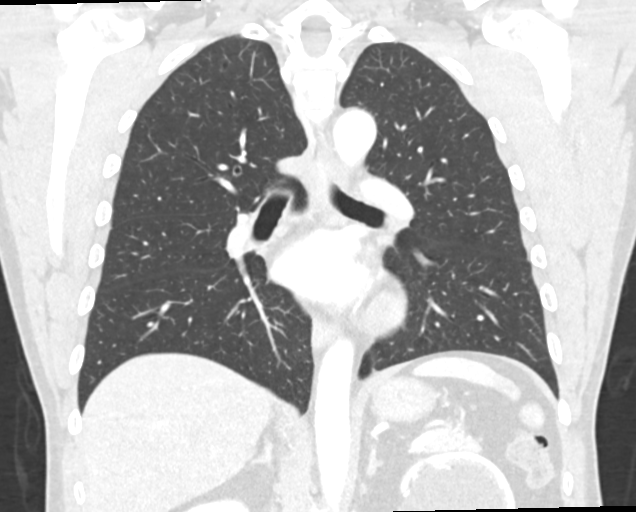
[im 95/158  lung]
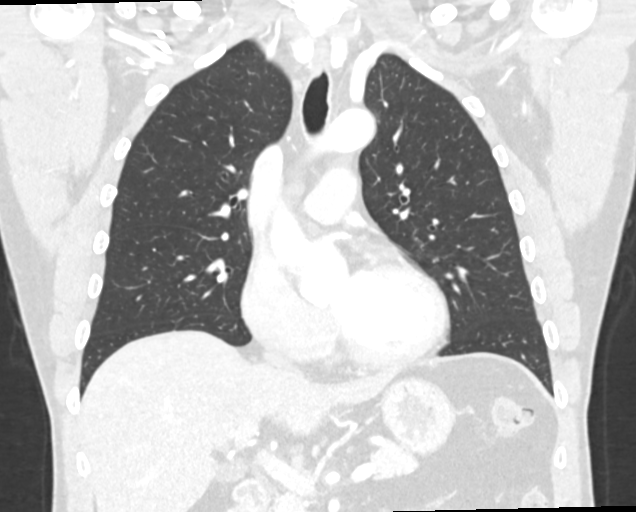

[12 of 36 positions shown; findings below may reference images not displayed]

FINDINGS: Cardiovascular: Thoracic aorta and its branches are within normal
limits. No atherosclerotic calcifications are seen. No aneurysmal
dilatation or dissection is noted. No cardiac enlargement is seen.
No significant coronary calcifications are noted. The pulmonary
artery shows no large central pulmonary embolism although timing for
embolus evaluation was not performed.

Mediastinum/Nodes: Thoracic inlet is within normal limits. No hilar
or mediastinal adenopathy is seen. The esophagus is within normal
limits.

Lungs/Pleura: The lungs are well aerated bilaterally. No focal
infiltrate or sizable effusion is seen. No pneumothorax is noted.

Upper Abdomen: Visualized upper abdomen demonstrates nonobstructing
renal calculi on the left. Considerable fullness of the collecting
system is noted with cortical thinning. This is consistent with
long-standing dilatation. The etiology of which is incompletely
evaluated.

Musculoskeletal: Mild degenerative changes of the thoracic spine are
seen. No compression deformity is noted. No rib abnormality is seen.
IMPRESSION: No acute abnormality within the chest.

Nonobstructing left renal calculi with evidence of significant
longstanding hydronephrosis and cortical thinning. This is unchanged
from 07/23/2005 and appears to be related to left UPJ obstruction.

## 2021-07-18 ENCOUNTER — Other Ambulatory Visit: Payer: Self-pay | Admitting: Dermatology

## 2021-07-19 ENCOUNTER — Other Ambulatory Visit: Payer: Self-pay | Admitting: Dermatology

## 2021-09-07 ENCOUNTER — Ambulatory Visit (INDEPENDENT_AMBULATORY_CARE_PROVIDER_SITE_OTHER): Payer: 59 | Admitting: Dermatology

## 2021-09-07 ENCOUNTER — Encounter: Payer: Self-pay | Admitting: Dermatology

## 2021-09-07 DIAGNOSIS — L738 Other specified follicular disorders: Secondary | ICD-10-CM | POA: Diagnosis not present

## 2021-09-07 DIAGNOSIS — D229 Melanocytic nevi, unspecified: Secondary | ICD-10-CM

## 2021-09-07 DIAGNOSIS — L821 Other seborrheic keratosis: Secondary | ICD-10-CM | POA: Diagnosis not present

## 2021-09-07 DIAGNOSIS — L719 Rosacea, unspecified: Secondary | ICD-10-CM

## 2021-09-07 DIAGNOSIS — L810 Postinflammatory hyperpigmentation: Secondary | ICD-10-CM

## 2021-09-07 DIAGNOSIS — Z1283 Encounter for screening for malignant neoplasm of skin: Secondary | ICD-10-CM

## 2021-09-07 DIAGNOSIS — D225 Melanocytic nevi of trunk: Secondary | ICD-10-CM

## 2021-09-07 MED ORDER — AMZEEQ 4 % EX FOAM: 1.0000 | Freq: Every day | CUTANEOUS | 11 refills | Status: DC

## 2021-09-07 NOTE — Patient Instructions (Signed)
If you do not hear from [facility] by the end of the day today give them a call tomorrow @ [phone]. Dr. Bartholomew Boards [phone]

## 2021-09-12 ENCOUNTER — Telehealth: Payer: Self-pay | Admitting: Dermatology

## 2021-09-12 NOTE — Telephone Encounter (Signed)
Patient aware its on his AVS

## 2021-09-12 NOTE — Telephone Encounter (Signed)
Wants name that ST was going to give him of doctor in [place] for laser treatment of rosacea

## 2021-09-30 ENCOUNTER — Encounter: Payer: Self-pay | Admitting: Dermatology

## 2021-09-30 NOTE — Progress Notes (Signed)
   Follow-Up Visit   Subjective  Tom Santana is a 56 y.o. male who presents for the following: Skin Problem (Patient here today for new lesion on the tip of his nose x 1.5 months no bleeding, no pain. Per patient he also has discoloration on his nose x 1.5 months. Per patient he has rosacea and he's using Rhofade for the rosacea. ).  Sedation, skin check, several spots of concern Location:  Duration:  Quality:  Associated Signs/Symptoms: Modifying Factors:  Severity:  Timing: Context:   Objective  Well appearing patient in no apparent distress; mood and affect are within normal limits. Waist Up Waist up skin examination: No atypical pigmented lesions (all checked with dermoscopy), no nonmelanoma skin cancer  Mid Tip of Nose Subtle 2 mm flesh-colored elevation with tiny eccentric dell, compatible dermoscopy  Dorsum of Nose The pigmented 4 mm macule with dermoscopic atypia  Left Flank Millimeter brown textured flattopped papule, compatible dermoscopy  Right Abdomen (side) - Upper 3 mm dark macule, no dermoscopic atypia  Dorsum of Nose, Left Nasal Sidewall, Right Nasal Sidewall Active inflammatory central facial erythema    All skin waist up examined.   Assessment & Plan    Skin exam for malignant neoplasm Waist Up  Yearly skin check  Sebaceous hyperplasia Mid Tip of Nose  told of similar appearance of early BCC so if there is growth or bleeding return for biopsy  Post-inflammatory hyperpigmentation Dorsum of Nose  Leave if stable  Seborrheic keratosis Left Flank  Leave if stable  Skin mole Right Abdomen (side) - Upper  Recheck if there is clinical change  Rosacea Left Nasal Sidewall; Right Nasal Sidewall; Dorsum of Nose  All treatment options reviewed along with the possible role of triggers.  We will add the new topical minocycline (if out-of-pocket cost is not too high) nightly for 6 weeks; follow-up by MyChart at that time  Minocycline HCl  Micronized (AMZEEQ) 4 % FOAM - Dorsum of Nose, Left Nasal Sidewall, Right Nasal Sidewall Apply 1 Application topically at bedtime.      I, Lavonna Monarch, MD, have reviewed all documentation for this visit.  The documentation on 09/30/21 for the exam, diagnosis, procedures, and orders are all accurate and complete.

## 2021-12-20 ENCOUNTER — Telehealth: Payer: Self-pay | Admitting: Neurology

## 2021-12-20 NOTE — Telephone Encounter (Signed)
LVM and sent mychart msg informing pt of need to reschedule 11/2 appointment - MD out

## 2021-12-21 ENCOUNTER — Ambulatory Visit: Payer: 59 | Admitting: Neurology

## 2022-03-12 ENCOUNTER — Encounter: Payer: Self-pay | Admitting: Neurology

## 2022-03-12 ENCOUNTER — Ambulatory Visit (INDEPENDENT_AMBULATORY_CARE_PROVIDER_SITE_OTHER): Payer: 59 | Admitting: Neurology

## 2022-03-12 ENCOUNTER — Telehealth: Payer: Self-pay | Admitting: Neurology

## 2022-03-12 VITALS — BP 119/72 | HR 81 | Ht 72.0 in | Wt 207.6 lb

## 2022-03-12 DIAGNOSIS — R251 Tremor, unspecified: Secondary | ICD-10-CM

## 2022-03-12 NOTE — Progress Notes (Signed)
Subjective:    Patient ID: Tom Santana is a 57 y.o. male.  HPI   Interim history:   Tom Santana is a 56 year old right-handed gentleman with an underlying medical history of systolic dysfunction, aortic atherosclerosis, low testosterone, anxiety, depression, reflux disease, and overweight state, who presents for follow-up consultation of his hand tremor.  The patient is unaccompanied today.  I first met him at the request of his primary care physician on 12/20/2020, at which time he reported a 3 to 4-year history of left hand tremor.  Examination was not classic for essential tremor, no evidence of parkinsonism seen at the time on exam.  He was advised to proceed with a brain MRI.  He had a brain MRI with and without contrast on 02/01/2021 and I reviewed the results: IMPRESSION:    MRI brain (with and without) demonstrating: - Mild-moderate periventricular and subcortical round and ovoid T2 hyperintensities. These findings are non-specific and considerations include autoimmune, inflammatory, post-infectious, microvascular ischemic or migraine associated etiologies.  - No acute findings.  He was notified of the results by phone call.  Today, 03/12/2022: He reports doing well, feels stable.  He reports that his long-term care insurance application was denied based on his brain MRI which is curious to me as to why it was denied.  His vascular risk factors are minimal, he does have a prior history of smoking but quit several years ago.  He has not had any worsening of his tremor, if anything he feels that the tremor is stable or slightly better.  He has not fallen, he exercises on a regular basis, he limits his caffeine.  He does not currently drink any alcohol.  He reports that he sleeps well.  He endorses recent stress, sadly, he lost his mom this past week.  The funeral was on the weekend.  His father is 68 years old and in good health.  The patient's allergies, current medications, family history,  past medical history, past social history, past surgical history and problem list were reviewed and updated as appropriate.   Previously:   12/20/20: (He) reports a hand tremor for the past few years.  He first noticed it some 3 to 4 years ago, it was very rare and intermittent at the time.  In the past approximately 1 year he has noticed the tremor more consistently.  It is only in the left hand with certain position, he can change in position to alleviate the tremor.  He has not noticed any telltale triggers or alleviating factors but does notice the tremor to be worse when he is tired or stressed out.  Does have intermittent anxiety for which he takes Ativan in low-dose rarely.  He does not drink any alcohol.  He does drink caffeine in the form of coffee.  He, estimates that he drinks about 500 or 600 mg of coffee per day but this is not a new occurrence.  He quit smoking cigarettes some 5 years ago and now vapes nicotine.  He lives with his wife.  He does snore and has some sleep disruption, some restless sleep according to his sleep number bed.  He has not twice per night, he has never had a sleep study. He does not endorse a family history of tremors.  He has 2 sisters.  Mom is 74 and has Alzheimer's dementia.  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  While he finds this  embarrassing, he has not had any impairment in his work with the tremor.  He is right-handed.  I reviewed your office note from 04/11/2020.  He had blood work through your office and I was able to review results from 03/29/2020.  CBC with differential was unremarkable with minor changes including mildly elevated lymphocyte count.  CMP showed glucose of 75, BUN 10, creatinine 1.07, sodium 140, potassium 3.9.  Alk phos 162, ALT 23, AST 20.  ESR was less than 1, lipase was 53, amylase was 57 on 01/01/2020.  T4 and TSH were checked on 06/11/2019 and normal.  A1c was  5.5 on 05/01/2019.   His Past Medical History Is Significant For: Past Medical History:  Diagnosis Date   Anxiety    COVID-19 02/2020   Healthy adolescent on routine physical examination    Low testosterone    UPJ (ureteropelvic junction) obstruction     His Past Surgical History Is Significant For: Past Surgical History:  Procedure Laterality Date   herniated disc     04/2020 EPSI   LEG SUGERY  2007   MRSA INFECTION   NM MYOVIEW LTD  01/2017   NORMAL/LOW RISK.  EF 51% no regional wall motion normality.  No EKG changes.  Excellent exercise tolerance -13 minutes (15 METs).  No ischemia/infarction   PYELOPASTY  2003    His Family History Is Significant For: Family History  Problem Relation Age of Onset   Dementia Mother    Diabetes Sister    Stroke Maternal Grandmother    Tremor Neg Hx     His Social History Is Significant For: Social History   Socioeconomic History   Marital status: Married    Spouse name: Not on file   Number of children: Not on file   Years of education: Not on file   Highest education level: Not on file  Occupational History   Not on file  Tobacco Use   Smoking status: Former    Types: Cigarettes    Quit date: 12/05/2016    Years since quitting: 5.2   Smokeless tobacco: Never  Vaping Use   Vaping Use: Every day   Start date: 12/05/2016  Substance and Sexual Activity   Alcohol use: No    Comment: WAS A HEAVY DRINKER IN THE PAST QUIT 09/2015    Drug use: No   Sexual activity: Not on file  Other Topics Concern   Not on file  Social History Narrative    he is married , 2 kids grown)   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   He is a former heavy smoker, but is down to less than 5 a day.  Was also former heavy drinker from ages [age].  Now minimal.   He tries to exercise about 6 days a week for about an hour either weightlifting bicycling or walking/running on the treadmill.   Social Determinants of Health    Financial Resource Strain: Not on file  Food Insecurity: Not on file  Transportation Needs: Not on file  Physical Activity: Not on file  Stress: Not on file  Social Connections: Not on file    His Allergies Are:  No Known Allergies:   His Current Medications Are:  Outpatient Encounter Medications as of 03/12/2022  Medication Sig   anastrozole (ARIMIDEX) 1 MG tablet Take 1 mg by mouth every other day.    LORazepam (ATIVAN) 0.5 MG tablet Take 0.5 mg by mouth as needed for anxiety.  Minocycline HCl Micronized (AMZEEQ) 4 % FOAM Apply 1 Application topically at bedtime.   Tadalafil 2.5 MG TABS Take by mouth daily.    testosterone cypionate (DEPOTESTOSTERONE CYPIONATE) 200 MG/ML injection Inject 1 mL into the muscle once a week.   No facility-administered encounter medications on file as of 03/12/2022.  :  Review of Systems:  Out of a complete 14 point review of systems, all are reviewed and negative with the exception of these symptoms as listed below:  Review of Systems  Neurological:        Follow up for tremor of left hand.   Stable.     Objective:  Neurological Exam  Physical Exam Physical Examination:   Vitals:   03/12/22 0942  BP: 119/72  Pulse: 81   General Examination: The patient is a very pleasant 57 y.o. male in no acute distress. He appears well-developed and well-nourished and well groomed.   HEENT: Normocephalic, atraumatic, pupils are equal, round and reactive, extraocular tracking well-preserved, hearing is grossly intact. Face is symmetric with normal facial animation.  Speech is clear without dysarthria, hypophonia or voice tremor.  He has no lip, neck or jaw tremor.  Neck is supple with full range of motion, no carotid bruits. Oropharynx exam reveals: mild mouth dryness, good dental hygiene and moderate airway crowding, due to small airway entry, somewhat wider uvula, tonsils on the smaller side.  Mallampati class II.  Tongue protrudes centrally and palate  elevates symmetrically.    Chest: Clear to auscultation without wheezing, rhonchi or crackles noted.   Heart: S1+S2+0, regular and normal without murmurs, rubs or gallops noted.    Abdomen: Soft, non-tender and non-distended.   Extremities: There is no pitting edema in the distal lower extremities bilaterally.    Skin: Warm and dry without trophic changes noted.    Musculoskeletal: exam reveals no obvious joint deformities.    Neurologically:  Mental status: The patient is awake, alert and oriented in all 4 spheres. His immediate and remote memory, attention, language skills and fund of knowledge are appropriate. There is no evidence of aphasia, agnosia, apraxia or anomia. Speech is clear with normal prosody and enunciation. Thought process is linear. Mood is normal and affect is normal.  Cranial nerves II - XII are as described above under HEENT exam.  Motor exam: Normal bulk, strength and tone is noted, in particular, no rigidity or cogwheeling. There is no obvious resting tremor.   (On 12/20/2020: On Archimedes spiral drawing, he has mild insecurity with the right hand but no trembling, very mild trembling with the left hand, handwriting with the right hand is legible, not particularly tremulous or micrographic.)  He has a very slight and intermittent trembling in the left thumb area.  No other postural or action tremor, no resting tremor in the lower extremities.  No right hand tremor.   Romberg is negative. Reflexes are 1-2+ throughout. Fine motor skills and coordination: Intact with finger taps, hand movements, rapid alternating patting and foot taps bilaterally, symmetrical, no decrement in amplitude noted.    Cerebellar testing: No dysmetria or intention tremor. There is no truncal or gait ataxia.   Sensory exam: intact to light touch in both upper and lower extremities.  Gait, station and balance: He stands easily. No veering to one side is noted. No leaning to one side is noted.  Posture is age-appropriate and stance is narrow based. Gait shows normal stride length and normal pace. No problems turning are noted.  Assessment and Plan:  In summary, Tom Santana is a very pleasant 57 year old with an underlying medical history of systolic dysfunction, aortic atherosclerosis, low testosterone, anxiety, depression, reflux disease, and overweight state, who presents for follow-up consultation of his intermittent left hand tremor of several years duration.  He feels stable.  He has a stable exam as well.  He has not been affected by his tremor, no issues with the day-to-day activity or performing his job duties or doing his ADLs.  He is reassured, brain MRI from December 2022 showed chronic white matter changes, we talked about these and possible risk factors, he is vascular risk factors a very limited thankfully.  He does have a history of smoking and quit about 5 years ago.  We can certainly consider a comparison MRI down the road but there is no pressing need for this at this time.  There was no structural cause for his tremor and we mutually agreed to follow him clinically.  He can follow-up in this clinic in 1 to 2 years.  I would be happy to provide any additional information in letter form for his insurance application, I believe he should be a good candidate for long-term care insurance.  We talked about the importance of maintaining a healthy lifestyle.  He stays well-hydrated, he does not currently drink any alcohol, he exercises on a regular basis.  He is commended for his healthy lifestyle.  He is advised to call us with any interim questions or concerns and we can follow him infrequently at this point.   I spent 30 minutes in total face-to-face time and in reviewing records during pre-charting, more than 50% of which was spent in counseling and coordination of care, reviewing test results, reviewing medications and treatment regimen and/or in discussing or reviewing  the diagnosis of intermittent tremor, the prognosis and treatment options. Pertinent laboratory and imaging test results that were available during this visit with the patient were reviewed by me and considered in my medical decision making (see chart for details).

## 2022-03-12 NOTE — Telephone Encounter (Signed)
Please furnish a letter of support so patient can resubmit application for long-term care insurance.   To whom it may concern:  Tom Santana was evaluated for an intermittent hand tremor in 2022.  His symptoms have remained stable and his exam is also stable, without any obvious cause for his tremor.  In particular, there is no evidence of parkinsonism at this point in time.  We pursued a brain MRI to rule out a structural cause of his tremor.  His brain MRI from December 2022 did not show any obvious structural cause for his tremor, no acute findings, no abnormal contrast uptake.  He was found to have mild to moderate vascular changes. His risk factors for vascular disease are low at this point.  At this juncture, there are no obvious neurological concerns about his eligibility for long-term care insurance.

## 2022-03-13 ENCOUNTER — Encounter: Payer: Self-pay | Admitting: *Deleted

## 2022-03-13 NOTE — Telephone Encounter (Signed)
LMVM for pt that we can send letter thru mychart, not signed, or can email signed copy. Let us know. Or can pick up copy.

## 2022-03-13 NOTE — Telephone Encounter (Addendum)
Letter printed and ready for Dr Guadelupe Sabin signature.

## 2022-03-14 ENCOUNTER — Encounter: Payer: Self-pay | Admitting: *Deleted

## 2022-03-14 NOTE — Telephone Encounter (Signed)
Emailed signed letter to pts email [email or web address]  and also mycharted letter to him as well.

## 2022-03-19 ENCOUNTER — Ambulatory Visit: Payer: 59 | Admitting: Cardiology

## 2022-08-22 ENCOUNTER — Ambulatory Visit: Payer: 59 | Attending: Cardiology | Admitting: Cardiology

## 2022-08-22 ENCOUNTER — Encounter: Payer: Self-pay | Admitting: Cardiology

## 2022-08-22 VITALS — BP 120/72 | HR 68 | Ht 72.0 in | Wt 199.8 lb

## 2022-08-22 DIAGNOSIS — R002 Palpitations: Secondary | ICD-10-CM | POA: Diagnosis not present

## 2022-08-22 DIAGNOSIS — I519 Heart disease, unspecified: Secondary | ICD-10-CM | POA: Diagnosis not present

## 2022-08-22 DIAGNOSIS — R079 Chest pain, unspecified: Secondary | ICD-10-CM | POA: Diagnosis not present

## 2022-08-22 NOTE — Progress Notes (Unsigned)
Cardiology Office Note:  .   Date:  08/23/2022  ID:  Tom Santana, DOB 04-Aug-1965, MRN [id number] PCP: Farris Has, MD  [facility] Providers Cardiologist:  Bryan Lemma, MD N/    No chief complaint on file.   History of Present Illness: .     Tom Santana is a  57 y.o. male with PMH notable for mildly reduced EF on echo in the past with essentially normal Coronary CTA.  Plan was for intermittent follow-up to every 2 to 3 years.  He presents here for 3-year follow-up of palpitations at the request of Farris Has, MD.  Tom Santana was last seen on September 21, 2019 -> exercising 30 to 45 minutes a day without any major issues.  Doing cardio and weight training.  Heart rates up to the 120s-130s.  No chest pain or pressure just a little lightheaded.  Indicated he did not necessarily drink that much.    Subjective   INTERVAL HISTORY Tom Santana returns today for follow-up stating that he "feels great ".  No major cardiac symptoms at all.  He says he vapes every now and then but otherwise is very active still exercising 4 to 5 days a week up to 30 in the evening 45 minutes a day.  Some days are more vigorous than others but for the most part he denies any real chest tightness pressure pain or, or shortness of breath with rest or exertion.  No arrhythmia symptoms.  No CHF symptoms of PND, orthopnea or edema. He had a question about previous finding on his CT scan is suggestive of aortic atherosclerosis.  We actually pull up the images that showed a small amount of calcification in the distal aorta with no indication of any stenosis or occlusion.  ROS:  Cardiovascular ROS: no chest pain or dyspnea on exertion negative for - edema, irregular heartbeat, orthopnea, palpitations, paroxysmal nocturnal dyspnea, rapid heart rate, shortness of breath, or syncope or near syncope, TIA/CVA/amaurosis fugax or claudication Review of Systems - Negative except has days when he may not  feel it as much exercise otherwise but nothing notable.     Objective   Studies Reviewed: Marland Kitchen        ECHO: 11-16-2018: Mild to moderately reduced EF of 40 to 45%.  Mildly dilated LV with increased thickness/LVH.  Interestingly, normal diastolic parameters.  Normal RV mild MV thickening/calcification/MAC but no stenosis.  MONITOR September 2020: Mostly sinus rhythm with a range of 42 to 138 bpm and average of 73 bpm.  Rare isolated PACs and even more rare PVCs.  No evidence of arrhythmias-either fast or slow. Coronary CTA 10/28/2018: Coronary Calcium Score 0.  Normal coronary artery origin with right dominance and no evidence of CAD no aortic or aortic valve calcification.  Risk Assessment/Calculations:              Physical Exam:   VS:  BP 120/72 (BP Location: Left Arm, Patient Position: Sitting, Cuff Size: Normal)   Pulse 68   Ht 6' (1.829 m)   Wt 199 lb 12.8 oz (90.6 kg)   SpO2 97%   BMI 27.10 kg/m    Wt Readings from Last 3 Encounters:  08/22/22 199 lb 12.8 oz (90.6 kg)  03/12/22 207 lb 9.6 oz (94.2 kg)  12/20/20 213 lb 6.4 oz (96.8 kg)    GEN: Well nourished, well developed in no acute distress; well-groomed.  Healthy-appearing. NECK: No JVD; No carotid bruits CARDIAC: Normal S1, S2;  RRR, no murmurs, rubs, gallops RESPIRATORY:  Clear to auscultation without rales, wheezing or rhonchi ; nonlabored, good air movement. ABDOMEN: Soft, non-tender, non-distended EXTREMITIES:  No edema; No deformity      ASSESSMENT AND PLAN: .    Problem List Items Addressed This Visit       Cardiology Problems   Systolic dysfunction, left ventricle    Interesting, his EF was down by echo in the setting of what looked like a stress related event.  Never had a recheck of his EF, we talked about potentially rechecking an echocardiogram in the future, but in the absence of symptoms, no rush.  Would only consider rechecking if there was more urgency.  Interestingly, he has no heart failure  symptoms of PND, orthopnea or edema.  He has no symptoms and I will see that there is a good reason to reassess the EF.  It is possible that his EF can be down from longstanding alcohol use.  Treatment is limited by lowish blood pressures.  BP BP looks great today and he is totally asymptomatic and would like to try to avoid medications.  As such we will just simply monitor.        Other   Heart palpitations    Pretty much controlled without medications.  Just has an every now and then nothing significant.  On no medications.      Chest pain with low risk for cardiac etiology - Primary    We reviewed his symptoms having had a Myoview stress test in Coronary CTA they were both nonischemic despite his EF being down.  Has not had any further chest pain.  Coronary Calcium Score 0.               Dispo: Return in about 2 years (around 08/21/2024). => Plan 2 to 3-year follow-up  Total time spent: 19  min spent with patient + 13 min spent charting = 32 min     Signed, Marykay Lex, MD, MS Bryan Lemma, M.D., M.S. Interventional Cardiologist  [facility]  Pager # [phone] Phone # [phone] [address] [place], [place] [address]

## 2022-08-22 NOTE — Patient Instructions (Addendum)
Medication Instructions:   No changes    Lab Work: Not needed    Testing/Procedures: Not needed   Follow-Up: At [facility], you and your health needs are our priority.  As part of our continuing mission to provide you with exceptional heart care, we have created designated Provider Care Teams.  These Care Teams include your primary Cardiologist (physician) and Advanced Practice Providers (APPs -  Physician Assistants and Nurse Practitioners) who all work together to provide you with the care you need, when you need it.     Your next appointment:   2 year(s)  The format for your next appointment:   In Person  Provider:   David Harding, MD   

## 2022-08-23 NOTE — Assessment & Plan Note (Signed)
We reviewed his symptoms having had a Myoview stress test in Coronary CTA they were both nonischemic despite his EF being down.  Has not had any further chest pain.  Coronary Calcium Score 0.

## 2022-08-23 NOTE — Assessment & Plan Note (Addendum)
Interesting, his EF was down by echo in the setting of what looked like a stress related event.  Never had a recheck of his EF, we talked about potentially rechecking an echocardiogram in the future, but in the absence of symptoms, no rush.  Would only consider rechecking if there was more urgency.  Interestingly, he has no heart failure symptoms of PND, orthopnea or edema.  He has no symptoms and I will see that there is a good reason to reassess the EF.  It is possible that his EF can be down from longstanding alcohol use.  Treatment is limited by lowish blood pressures.  BP BP looks great today and he is totally asymptomatic and would like to try to avoid medications.  As such we will just simply monitor.

## 2022-08-23 NOTE — Assessment & Plan Note (Addendum)
Pretty much controlled without medications.  Just has an every now and then nothing significant.  On no medications.

## 2023-11-19 ENCOUNTER — Ambulatory Visit: Admitting: Neurology

## 2023-12-05 ENCOUNTER — Ambulatory Visit (INDEPENDENT_AMBULATORY_CARE_PROVIDER_SITE_OTHER): Admitting: Neurology

## 2023-12-05 ENCOUNTER — Encounter: Payer: Self-pay | Admitting: *Deleted

## 2023-12-05 ENCOUNTER — Encounter: Payer: Self-pay | Admitting: Neurology

## 2023-12-05 VITALS — BP 108/73 | HR 77 | Ht 72.0 in | Wt 202.6 lb

## 2023-12-05 DIAGNOSIS — R251 Tremor, unspecified: Secondary | ICD-10-CM | POA: Diagnosis not present

## 2023-12-05 NOTE — Progress Notes (Signed)
 Subjective:    Patient ID: Tom Santana is a 58 y.o. male.  HPI    True Mar, MD, PhD [facility] [address], [address] [address] [place], [place] [address]  Tom Santana is a 58 year old right-handed gentleman with an underlying medical history of systolic dysfunction, aortic atherosclerosis, low testosterone , anxiety, depression, reflux disease, and overweight state, who presents for follow-up consultation of his hand tremor.  The patient is unaccompanied today.  He was last seen in this clinic in January 2024, at which time we mutually agreed to continue to follow him clinically.  Today, 12/05/2023: He reports feeling well.  He feels that his tremor has improved.  He has no new complaints and wanted to get an updated checkup.  He has no concerns today.  He and his wife are planning to move to [facility] next year, he will be coming to [place] about once a month on an ongoing basis.  The patient's allergies, current medications, family history, past medical history, past social history, past surgical history and problem list were reviewed and updated as appropriate.    Previously:   03/12/2022: I first met him at the request of his primary care physician on 12/20/2020, at which time he reported a 3 to 4-year history of left hand tremor.  Examination was not classic for essential tremor, no evidence of parkinsonism seen at the time on exam.  He was advised to proceed with a brain MRI.  He had a brain MRI with and without contrast on 02/01/2021 and I reviewed the results: IMPRESSION:    MRI brain (with and without) demonstrating: - Mild-moderate periventricular and subcortical round and ovoid T2 hyperintensities. These findings are non-specific and considerations include autoimmune, inflammatory, post-infectious, microvascular ischemic or migraine associated etiologies.  - No acute findings.   He was notified of the results by phone call.   He reports doing well,  feels stable.  He reports that his long-term care insurance application was denied based on his brain MRI which is curious to me as to why it was denied.  His vascular risk factors are minimal, he does have a prior history of smoking but quit several years ago.  He has not had any worsening of his tremor, if anything he feels that the tremor is stable or slightly better.  He has not fallen, he exercises on a regular basis, he limits his caffeine.  He does not currently drink any alcohol.  He reports that he sleeps well.  He endorses recent stress, sadly, he lost his mom this past week.  The funeral was on the weekend.  His father is 23 years old and in good health.     12/20/20: (He) reports a hand tremor for the past few years.  He first noticed it some 3 to 4 years ago, it was very rare and intermittent at the time.  In the past approximately 1 year he has noticed the tremor more consistently.  It is only in the left hand with certain position, he can change in position to alleviate the tremor.  He has not noticed any telltale triggers or alleviating factors but does notice the tremor to be worse when he is tired or stressed out.  Does have intermittent anxiety for which he takes Ativan in low-dose rarely.  He does not drink any alcohol.  He does drink caffeine in the form of coffee.  He, estimates that he drinks about 500 or 600 mg of coffee per day  but this is not a new occurrence.  He quit smoking cigarettes some 5 years ago and now vapes nicotine.  He lives with his wife.  He does snore and has some sleep disruption, some restless sleep according to his sleep number bed.  He has not twice per night, he has never had a sleep study. He does not endorse a family history of tremors.  He has 2 sisters.  Mom is 25 and has Alzheimer's dementia.  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  While he finds this embarrassing, he has not had  any impairment in his work with the tremor.  He is right-handed.  I reviewed your office note from 04/11/2020.  He had blood work through your office and I was able to review results from 03/29/2020.  CBC with differential was unremarkable with minor changes including mildly elevated lymphocyte count.  CMP showed glucose of 75, BUN 10, creatinine 1.07, sodium 140, potassium 3.9.  Alk phos 162, ALT 23, AST 20.  ESR was less than 1, lipase was 53, amylase was 57 on 01/01/2020.  T4 and TSH were checked on 06/11/2019 and normal.  A1c was 5.5 on 05/01/2019.   His Past Medical History Is Significant For: Past Medical History:  Diagnosis Date   Anxiety    COVID-19 02/2020   Healthy adolescent on routine physical examination    Low testosterone     UPJ (ureteropelvic junction) obstruction     His Past Surgical History Is Significant For: Past Surgical History:  Procedure Laterality Date   herniated disc     04/2020 EPSI   LEG SUGERY  2007   MRSA INFECTION   NM MYOVIEW  LTD  01/2017   NORMAL/LOW RISK.  EF 51% no regional wall motion normality.  No EKG changes.  Excellent exercise tolerance -13 minutes (15 METs).  No ischemia/infarction   PYELOPASTY  2003   ROTATOR CUFF REPAIR Right    2024    His Family History Is Significant For: Family History  Problem Relation Age of Onset   Dementia Mother    Diabetes Sister    Stroke Maternal Grandmother    Tremor Neg Hx     His Social History Is Significant For: Social History   Socioeconomic History   Marital status: Married    Spouse name: Not on file   Number of children: Not on file   Years of education: Not on file   Highest education level: Not on file  Occupational History   Not on file  Tobacco Use   Smoking status: Former    Current packs/day: 0.00    Types: Cigarettes    Quit date: 12/05/2016    Years since quitting: 7.0   Smokeless tobacco: Never  Vaping Use   Vaping status: Every Day   Start date: 12/05/2016  Substance and  Sexual Activity   Alcohol use: No    Comment: WAS A HEAVY DRINKER IN THE PAST QUIT 09/2015    Drug use: No   Sexual activity: Not on file  Other Topics Concern   Not on file  Social History Narrative    he is married , 2 kids grown)   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   He is a former heavy smoker, but is down to less than 5 a day.  Was also former heavy drinker from ages [age].  Now minimal.   He tries  to exercise about 6 days a week for about an hour either weightlifting bicycling or walking/running on the treadmill.   Social Drivers of Corporate investment banker Strain: Not on file  Food Insecurity: Not on file  Transportation Needs: Not on file  Physical Activity: Not on file  Stress: Not on file  Social Connections: Not on file    His Allergies Are:  No Known Allergies:   His Current Medications Are:  Outpatient Encounter Medications as of 12/05/2023  Medication Sig   anastrozole (ARIMIDEX) 1 MG tablet Take 1 mg by mouth every other day.  (Patient taking differently: Take 1 mg by mouth every other day. Pt takes 0.5 mg tablet 3x weekly)   LORazepam (ATIVAN) 0.5 MG tablet Take 0.5 mg by mouth as needed for anxiety.   Tadalafil 2.5 MG TABS Take by mouth daily.    testosterone  cypionate (DEPOTESTOSTERONE CYPIONATE) 200 MG/ML injection Inject 1 mL into the muscle once a week.   No facility-administered encounter medications on file as of 12/05/2023.  :   Review of Systems:  Out of a complete 14 point review of systems, all are reviewed and negative with the exception of these symptoms as listed below:   Review of Systems  Neurological:        Pt here for tremors Pt states tremors are a lot better Pt wants a letter about tremor for insurance company     Objective:  Neurological Exam  Physical Exam Physical Examination:   Vitals:   12/05/23 0946  BP: 108/73  Pulse: 77    General Examination: The patient is a very  pleasant 58 y.o. male in no acute distress. He appears well-developed and well-nourished and well groomed.   HEENT: Normocephalic, atraumatic, pupils are equal, round and reactive, extraocular tracking well-preserved, hearing is grossly intact.  Corrective eyeglasses in place.  Face is symmetric with normal facial animation.  Speech is clear without dysarthria, hypophonia or voice tremor.  He has no lip, neck or jaw tremor.  Neck is supple with full range of motion, no carotid bruits. Oropharynx exam reveals: Stable exam.    Chest: Clear to auscultation without wheezing, rhonchi or crackles noted.   Heart: S1+S2+0, regular and normal without murmurs, rubs or gallops noted.    Abdomen: Soft, non-tender and non-distended.   Extremities: There is no pitting edema in the distal lower extremities bilaterally.    Skin: Warm and dry without trophic changes noted.    Musculoskeletal: exam reveals no obvious joint deformities.    Neurologically:  Mental status: The patient is awake, alert and oriented in all 4 spheres. His immediate and remote memory, attention, language skills and fund of knowledge are appropriate. There is no evidence of aphasia, agnosia, apraxia or anomia. Speech is clear with normal prosody and enunciation. Thought process is linear. Mood is normal and affect is normal.  Cranial nerves II - XII are as described above under HEENT exam.  Motor exam: Normal bulk, strength and tone is noted, in particular, no rigidity or cogwheeling. There is no obvious resting tremor.  No drift.    (On 12/20/2020: On Archimedes spiral drawing, he has mild insecurity with the right hand but no trembling, very mild trembling with the left hand, handwriting with the right hand is legible, not particularly tremulous or micrographic.)   No obvious tremor with posture or action or resting today.    Reflexes are 1-2+ throughout, toes are downgoing. Fine motor skills and coordination: Intact with finger  taps, hand movements, rapid alternating patting and foot taps bilaterally, symmetrical, no decrement in amplitude noted.    Cerebellar testing: No dysmetria or intention tremor. There is no truncal or gait ataxia.  Normal heel-to-shin. Sensory exam: intact to light touch in both upper and lower extremities.  Gait, station and balance: He stands easily. No veering to one side is noted. No leaning to one side is noted. Posture is age-appropriate and stance is narrow based. Gait shows normal stride length and normal pace. No problems turning are noted.               Assessment and Plan:  In summary, Tom Santana is a very pleasant 59 year old right-handed gentleman with an underlying medical history of systolic dysfunction, aortic atherosclerosis, low testosterone , anxiety, depression, reflux disease, and overweight state, who presents for follow-up consultation of his hand tremor.he has a history of hand tremor, particularly left hand tremor for the past few years but stable symptoms and no progression noted, in fact, he feels improved and today's exam is rather benign, no obvious tremor.  His brain MRI from December 2022 was nonrevealing for a structural cause of tremors as well.  At this juncture, he is advised to follow-up with his PCP and see me on an as-needed basis.  He will be moving to [facility] next year but plans to come to [place] about once a month on an ongoing basis.  I would be happy to update my findings and the letter form for him as he is reapplying for long-term care insurance at some point.  He looks well today and is reassured.  I answered all his questions today and he was in agreement with our plan.  I spent 30 minutes in total face-to-face time and in reviewing records during pre-charting, more than 50% of which was spent in counseling and coordination of care, reviewing test results, reviewing medications and treatment regimen and/or in discussing or reviewing the diagnosis of  intermittent tremor, the prognosis and treatment options. Pertinent laboratory and imaging test results that were available during this visit with the patient were reviewed by me and considered in my medical decision making (see chart for details).

## 2023-12-05 NOTE — Patient Instructions (Signed)
 It was nice to see you again today.  You look well today.  No obvious tremor noted.  I would be happy to update your letter for support of your long-term care insurance.  You can follow-up as needed at this point.

## 2024-02-05 ENCOUNTER — Ambulatory Visit: Admitting: Neurology
